# Patient Record
Sex: Female | Born: 1945
Health system: Southern US, Community
[De-identification: ages and names within clinical notes are randomized; demographics above are authoritative.]

## PROBLEM LIST (undated history)

## (undated) DIAGNOSIS — Z789 Other specified health status: Secondary | ICD-10-CM

## (undated) DIAGNOSIS — D649 Anemia, unspecified: Secondary | ICD-10-CM

## (undated) DIAGNOSIS — M8580 Other specified disorders of bone density and structure, unspecified site: Secondary | ICD-10-CM

## (undated) DIAGNOSIS — D509 Iron deficiency anemia, unspecified: Secondary | ICD-10-CM

## (undated) HISTORY — DX: Anemia, unspecified: D64.9

## (undated) HISTORY — DX: Other specified disorders of bone density and structure, unspecified site: M85.80

## (undated) HISTORY — DX: Other specified health status: Z78.9

## (undated) HISTORY — DX: Iron deficiency anemia, unspecified: D50.9

## (undated) HISTORY — PX: OTHER SURGICAL HISTORY: SHX169

---

## 2001-11-13 ENCOUNTER — Encounter: Payer: Self-pay | Admitting: Orthopedic Surgery

## 2001-11-13 ENCOUNTER — Ambulatory Visit (HOSPITAL_COMMUNITY): Admission: RE | Admit: 2001-11-13 | Discharge: 2001-11-13 | Payer: Self-pay | Admitting: Orthopedic Surgery

## 2009-12-12 HISTORY — PX: APPENDECTOMY: SHX54

## 2010-01-06 ENCOUNTER — Inpatient Hospital Stay (HOSPITAL_COMMUNITY): Admission: EM | Admit: 2010-01-06 | Discharge: 2010-01-10 | Payer: Self-pay | Admitting: Emergency Medicine

## 2010-01-06 ENCOUNTER — Encounter (INDEPENDENT_AMBULATORY_CARE_PROVIDER_SITE_OTHER): Payer: Self-pay

## 2010-08-19 ENCOUNTER — Ambulatory Visit: Payer: Self-pay | Admitting: Oncology

## 2010-09-01 LAB — COMPREHENSIVE METABOLIC PANEL
ALT: 27 U/L (ref 0–35)
AST: 25 U/L (ref 0–37)
Albumin: 4.1 g/dL (ref 3.5–5.2)
Alkaline Phosphatase: 51 U/L (ref 39–117)
BUN: 16 mg/dL (ref 6–23)
CO2: 29 mEq/L (ref 19–32)
Calcium: 9.6 mg/dL (ref 8.4–10.5)
Chloride: 105 mEq/L (ref 96–112)
Creatinine, Ser: 0.76 mg/dL (ref 0.40–1.20)
Glucose, Bld: 112 mg/dL — ABNORMAL HIGH (ref 70–99)
Potassium: 4 mEq/L (ref 3.5–5.3)
Sodium: 141 mEq/L (ref 135–145)
Total Bilirubin: 1.1 mg/dL (ref 0.3–1.2)
Total Protein: 7.7 g/dL (ref 6.0–8.3)

## 2010-09-01 LAB — MORPHOLOGY: PLT EST: ADEQUATE

## 2010-09-01 LAB — CBC & DIFF AND RETIC
BASO%: 1.1 % (ref 0.0–2.0)
Basophils Absolute: 0.1 10*3/uL (ref 0.0–0.1)
EOS%: 1.1 % (ref 0.0–7.0)
Eosinophils Absolute: 0.1 10*3/uL (ref 0.0–0.5)
HCT: 33.5 % — ABNORMAL LOW (ref 34.8–46.6)
HGB: 11.3 g/dL — ABNORMAL LOW (ref 11.6–15.9)
Immature Retic Fract: 3.9 % (ref 0.00–10.70)
LYMPH%: 32.9 % (ref 14.0–49.7)
MCH: 32.2 pg (ref 25.1–34.0)
MCHC: 33.7 g/dL (ref 31.5–36.0)
MCV: 95.4 fL (ref 79.5–101.0)
MONO#: 0.3 10*3/uL (ref 0.1–0.9)
MONO%: 7.3 % (ref 0.0–14.0)
NEUT#: 2.5 10*3/uL (ref 1.5–6.5)
NEUT%: 57.6 % (ref 38.4–76.8)
Platelets: 284 10*3/uL (ref 145–400)
RBC: 3.51 10*6/uL — ABNORMAL LOW (ref 3.70–5.45)
RDW: 12.8 % (ref 11.2–14.5)
Retic %: 0.8 % (ref 0.50–1.50)
Retic Ct Abs: 28.08 10*3/uL (ref 18.30–72.70)
WBC: 4.4 10*3/uL (ref 3.9–10.3)
lymph#: 1.4 10*3/uL (ref 0.9–3.3)

## 2010-09-01 LAB — CHCC SMEAR

## 2010-09-01 LAB — LACTATE DEHYDROGENASE: LDH: 133 U/L (ref 94–250)

## 2010-09-06 LAB — IRON AND TIBC
%SAT: 26 % (ref 20–55)
Iron: 93 ug/dL (ref 42–145)
TIBC: 363 ug/dL (ref 250–470)
UIBC: 270 ug/dL

## 2010-09-06 LAB — HAPTOGLOBIN: Haptoglobin: 95 mg/dL (ref 16–200)

## 2010-09-06 LAB — PROTEIN ELECTROPHORESIS, SERUM
Albumin ELP: 60 % (ref 55.8–66.1)
Alpha-1-Globulin: 3.9 % (ref 2.9–4.9)
Alpha-2-Globulin: 10.1 % (ref 7.1–11.8)
Beta 2: 5 % (ref 3.2–6.5)
Beta Globulin: 6.2 % (ref 4.7–7.2)
Gamma Globulin: 14.8 % (ref 11.1–18.8)
Total Protein, Serum Electrophoresis: 7.3 g/dL (ref 6.0–8.3)

## 2010-09-06 LAB — FERRITIN: Ferritin: 16 ng/mL (ref 10–291)

## 2010-09-06 LAB — TSH: TSH: 1.138 u[IU]/mL (ref 0.350–4.500)

## 2010-09-06 LAB — METHYLMALONIC ACID, SERUM: Methylmalonic Acid, Quantitative: 199 nmol/L (ref 87–318)

## 2010-09-06 LAB — SEDIMENTATION RATE: Sed Rate: 8 mm/hr (ref 0–22)

## 2010-09-06 LAB — FOLATE: Folate: >20 ng/mL

## 2010-10-31 LAB — URINALYSIS, ROUTINE W REFLEX MICROSCOPIC
Bilirubin Urine: NEGATIVE
Hgb urine dipstick: NEGATIVE
Ketones, ur: NEGATIVE mg/dL
Nitrite: NEGATIVE
pH: 6 (ref 5.0–8.0)

## 2010-10-31 LAB — POCT I-STAT, CHEM 8
BUN: 10 mg/dL (ref 6–23)
Calcium, Ion: 1.02 mmol/L — ABNORMAL LOW (ref 1.12–1.32)
Chloride: 110 mEq/L (ref 96–112)
Creatinine, Ser: 0.7 mg/dL (ref 0.4–1.2)
Glucose, Bld: 152 mg/dL — ABNORMAL HIGH (ref 70–99)
HCT: 43 % (ref 36.0–46.0)

## 2010-10-31 LAB — CBC
HCT: 26.2 % — ABNORMAL LOW (ref 36.0–46.0)
HCT: 31.1 % — ABNORMAL LOW (ref 36.0–46.0)
Hemoglobin: 10.6 g/dL — ABNORMAL LOW (ref 12.0–15.0)
Hemoglobin: 9.1 g/dL — ABNORMAL LOW (ref 12.0–15.0)
MCHC: 34 g/dL (ref 30.0–36.0)
MCHC: 34.9 g/dL (ref 30.0–36.0)
MCV: 97.3 fL (ref 78.0–100.0)
MCV: 97.9 fL (ref 78.0–100.0)
Platelets: 218 10*3/uL (ref 150–400)
Platelets: 242 10*3/uL (ref 150–400)
RBC: 3.18 MIL/uL — ABNORMAL LOW (ref 3.87–5.11)
RDW: 13.5 % (ref 11.5–15.5)
RDW: 13.9 % (ref 11.5–15.5)
WBC: 7.4 10*3/uL (ref 4.0–10.5)

## 2010-10-31 LAB — DIFFERENTIAL
Basophils Absolute: 0 10*3/uL (ref 0.0–0.1)
Basophils Relative: 0 % (ref 0–1)
Eosinophils Absolute: 0 10*3/uL (ref 0.0–0.7)
Eosinophils Relative: 0 % (ref 0–5)
Lymphocytes Relative: 20 % (ref 12–46)
Lymphs Abs: 1.5 10*3/uL (ref 0.7–4.0)
Monocytes Absolute: 0.3 10*3/uL (ref 0.1–1.0)
Monocytes Relative: 5 % (ref 3–12)
Neutro Abs: 5.6 10*3/uL (ref 1.7–7.7)
Neutrophils Relative %: 75 % (ref 43–77)

## 2010-12-01 ENCOUNTER — Other Ambulatory Visit: Payer: Self-pay | Admitting: Medical

## 2010-12-01 ENCOUNTER — Encounter (HOSPITAL_BASED_OUTPATIENT_CLINIC_OR_DEPARTMENT_OTHER): Payer: BC Managed Care – PPO | Admitting: Oncology

## 2010-12-01 DIAGNOSIS — D649 Anemia, unspecified: Secondary | ICD-10-CM

## 2010-12-01 LAB — CBC WITH DIFFERENTIAL/PLATELET
EOS%: 1.5 % (ref 0.0–7.0)
LYMPH%: 33.7 % (ref 14.0–49.7)
MCH: 31.7 pg (ref 25.1–34.0)
MCV: 94.9 fL (ref 79.5–101.0)
MONO%: 7 % (ref 0.0–14.0)
Platelets: 299 10*3/uL (ref 145–400)
RBC: 3.5 10*6/uL — ABNORMAL LOW (ref 3.70–5.45)
RDW: 12.8 % (ref 11.2–14.5)
nRBC: 0 % (ref 0–0)

## 2010-12-01 LAB — FERRITIN: Ferritin: 13 ng/mL (ref 10–291)

## 2010-12-01 LAB — COMPREHENSIVE METABOLIC PANEL
ALT: 26 U/L (ref 0–35)
CO2: 26 mEq/L (ref 19–32)
Calcium: 9.8 mg/dL (ref 8.4–10.5)
Chloride: 104 mEq/L (ref 96–112)
Creatinine, Ser: 0.75 mg/dL (ref 0.40–1.20)
Glucose, Bld: 141 mg/dL — ABNORMAL HIGH (ref 70–99)
Sodium: 142 mEq/L (ref 135–145)
Total Bilirubin: 0.8 mg/dL (ref 0.3–1.2)
Total Protein: 7.2 g/dL (ref 6.0–8.3)

## 2010-12-01 LAB — IRON AND TIBC
%SAT: 24 % (ref 20–55)
Iron: 87 ug/dL (ref 42–145)

## 2010-12-01 LAB — MORPHOLOGY

## 2011-03-09 ENCOUNTER — Other Ambulatory Visit: Payer: Self-pay | Admitting: Oncology

## 2011-03-09 ENCOUNTER — Encounter (HOSPITAL_BASED_OUTPATIENT_CLINIC_OR_DEPARTMENT_OTHER): Payer: Medicare Other | Admitting: Oncology

## 2011-03-09 DIAGNOSIS — D649 Anemia, unspecified: Secondary | ICD-10-CM

## 2011-03-09 LAB — CBC WITH DIFFERENTIAL/PLATELET
BASO%: 1.3 % (ref 0.0–2.0)
Basophils Absolute: 0.1 10*3/uL (ref 0.0–0.1)
EOS%: 4.5 % (ref 0.0–7.0)
HCT: 32.2 % — ABNORMAL LOW (ref 34.8–46.6)
HGB: 10.9 g/dL — ABNORMAL LOW (ref 11.6–15.9)
LYMPH%: 31.4 % (ref 14.0–49.7)
MCH: 33.1 pg (ref 25.1–34.0)
MCHC: 34.1 g/dL (ref 31.5–36.0)
MCV: 97.3 fL (ref 79.5–101.0)
MONO%: 7 % (ref 0.0–14.0)
NEUT%: 55.8 % (ref 38.4–76.8)
lymph#: 1.6 10*3/uL (ref 0.9–3.3)

## 2011-03-09 LAB — COMPREHENSIVE METABOLIC PANEL
ALT: 15 U/L (ref 0–35)
AST: 19 U/L (ref 0–37)
Alkaline Phosphatase: 45 U/L (ref 39–117)
BUN: 22 mg/dL (ref 6–23)
Calcium: 9.5 mg/dL (ref 8.4–10.5)
Creatinine, Ser: 0.87 mg/dL (ref 0.50–1.10)
Total Bilirubin: 0.5 mg/dL (ref 0.3–1.2)

## 2011-06-09 ENCOUNTER — Other Ambulatory Visit: Payer: Self-pay | Admitting: Oncology

## 2011-06-09 ENCOUNTER — Encounter (HOSPITAL_BASED_OUTPATIENT_CLINIC_OR_DEPARTMENT_OTHER): Payer: Medicare Other | Admitting: Oncology

## 2011-06-09 DIAGNOSIS — D649 Anemia, unspecified: Secondary | ICD-10-CM

## 2011-06-09 LAB — CBC WITH DIFFERENTIAL/PLATELET
Basophils Absolute: 0.1 10*3/uL (ref 0.0–0.1)
Eosinophils Absolute: 0.1 10*3/uL (ref 0.0–0.5)
HCT: 33.8 % — ABNORMAL LOW (ref 34.8–46.6)
HGB: 11.5 g/dL — ABNORMAL LOW (ref 11.6–15.9)
MCH: 33.3 pg (ref 25.1–34.0)
MCV: 98 fL (ref 79.5–101.0)
MONO%: 9.9 % (ref 0.0–14.0)
NEUT#: 2.3 10*3/uL (ref 1.5–6.5)
NEUT%: 49.4 % (ref 38.4–76.8)
RDW: 13 % (ref 11.2–14.5)

## 2011-07-15 ENCOUNTER — Other Ambulatory Visit: Payer: Self-pay | Admitting: Oncology

## 2011-07-17 ENCOUNTER — Telehealth: Payer: Self-pay | Admitting: Oncology

## 2011-07-17 NOTE — Telephone Encounter (Signed)
lmonvm advising the pt of her r/s appts from 1/2/12013 to 08/28/2011 due to the md will be on cme week of 09/04/2011

## 2011-08-18 ENCOUNTER — Other Ambulatory Visit: Payer: Self-pay | Admitting: Obstetrics and Gynecology

## 2011-08-18 ENCOUNTER — Other Ambulatory Visit (HOSPITAL_COMMUNITY)
Admission: RE | Admit: 2011-08-18 | Discharge: 2011-08-18 | Disposition: A | Discharge status: Home / Self Care | Payer: Medicare Other | Source: Ambulatory Visit | Attending: Obstetrics and Gynecology | Admitting: Obstetrics and Gynecology

## 2011-08-18 DIAGNOSIS — Z124 Encounter for screening for malignant neoplasm of cervix: Secondary | ICD-10-CM | POA: Insufficient documentation

## 2011-08-26 ENCOUNTER — Encounter: Payer: Self-pay | Admitting: Oncology

## 2011-08-26 DIAGNOSIS — D649 Anemia, unspecified: Secondary | ICD-10-CM | POA: Insufficient documentation

## 2011-08-26 DIAGNOSIS — M8580 Other specified disorders of bone density and structure, unspecified site: Secondary | ICD-10-CM | POA: Insufficient documentation

## 2011-08-28 ENCOUNTER — Other Ambulatory Visit (HOSPITAL_BASED_OUTPATIENT_CLINIC_OR_DEPARTMENT_OTHER): Payer: Medicare Other | Admitting: Lab

## 2011-08-28 ENCOUNTER — Other Ambulatory Visit: Payer: Self-pay | Admitting: Oncology

## 2011-08-28 ENCOUNTER — Ambulatory Visit (HOSPITAL_BASED_OUTPATIENT_CLINIC_OR_DEPARTMENT_OTHER): Payer: Medicare Other | Admitting: Oncology

## 2011-08-28 ENCOUNTER — Telehealth: Payer: Self-pay | Admitting: Oncology

## 2011-08-28 DIAGNOSIS — M899 Disorder of bone, unspecified: Secondary | ICD-10-CM

## 2011-08-28 DIAGNOSIS — M8580 Other specified disorders of bone density and structure, unspecified site: Secondary | ICD-10-CM

## 2011-08-28 DIAGNOSIS — D649 Anemia, unspecified: Secondary | ICD-10-CM

## 2011-08-28 LAB — CBC WITH DIFFERENTIAL/PLATELET
Eosinophils Absolute: 0.1 10*3/uL (ref 0.0–0.5)
HCT: 32.7 % — ABNORMAL LOW (ref 34.8–46.6)
LYMPH%: 40.2 % (ref 14.0–49.7)
MCHC: 34.7 g/dL (ref 31.5–36.0)
MCV: 96.3 fL (ref 79.5–101.0)
MONO#: 0.3 10*3/uL (ref 0.1–0.9)
MONO%: 9.7 % (ref 0.0–14.0)
NEUT#: 1.5 10*3/uL (ref 1.5–6.5)
NEUT%: 45.3 % (ref 38.4–76.8)
Platelets: 275 10*3/uL (ref 145–400)
WBC: 3.3 10*3/uL — ABNORMAL LOW (ref 3.9–10.3)

## 2011-08-28 LAB — IRON AND TIBC
%SAT: 27 % (ref 20–55)
Iron: 93 ug/dL (ref 42–145)
TIBC: 346 ug/dL (ref 250–470)
UIBC: 253 ug/dL (ref 125–400)

## 2011-08-28 LAB — COMPREHENSIVE METABOLIC PANEL
BUN: 15 mg/dL (ref 6–23)
CO2: 31 mEq/L (ref 19–32)
Calcium: 9.4 mg/dL (ref 8.4–10.5)
Chloride: 105 mEq/L (ref 96–112)
Creatinine, Ser: 0.82 mg/dL (ref 0.50–1.10)

## 2011-08-28 NOTE — Progress Notes (Signed)
[facility] OFFICE PROGRESS NOTE CC: Joanna Watkins. Cliffton Asters, M.D.   DIAGNOSIS:  Chronic normocytic anemia.  CURRENT THERAPY:  Watchful observation.  HISTORY OF PRESENT ILLNESS:  Joanna Watkins presents today for an office followup visit.  Overall she reports she has been doing relatively well. She has had anemia workup in the past and there was no clear evidence of any type of iron deficiency.  She had no evidence of any type of hemolysis on laboratory tests.  Of note in the past she did have a neo perforated appendicitis and her anemia possibly could be chronic inflammation from that.  She is not reporting any type of obvious bleeding such as melena, hematochezia, hematuria or any vaginal bleeding.  She is not reporting any headaches, any visual changes, nausea, vomiting, diarrhea, constipation, abdominal pain, abdominal swelling, lower extremity paresthesias, any bowel or bladder incontinence, any mucositis, dysphagia, odynophagia, any fevers, chills or night sweats.  No palpable lymph nodes.  She does not report any type of skin rash or joint swelling.  Overall she reports she has a good appetite and is eating and drinking quite well.SHe states her next full physical by her PCP is on Wednesday, Jan 23.   MEDICAL HISTORY: Past Medical History  Diagnosis Date  . Osteopenia   . Allergy history unknown   . Anemia     SURGICAL HISTORY:  Past Surgical History  Procedure Date  . Appendectomy 12/2009  . C4 fracture     MEDICATIONS: No current outpatient prescriptions on file.    ALLERGIES:   has no allergies on file.  REVIEW OF SYSTEMS:  The rest of the 14-point review of system was negative. She is up to date with her Pap exams and mammograms. She may be due for a colonoscopy this year. Last one was 1 year ago. She is to discuss with her primary physiscian on this issue.  Filed Vitals:   08/28/11 1009  BP: 119/73  Pulse: 67  Temp: 97.5 F (36.4 C)   Wt Readings from Last 3  Encounters:  08/28/11 111 lb (50.349 kg)   ECOG Performance status:   PHYSICAL EXAMINATION:  This is a very pleasant thin-appearing white female in no acute distress.  ECOG performance status is 0. HEENT:  Normocephalic, atraumatic skull.  There are no oropharyngeal lesions.  Neck:  Supple.  Lymph nodes:  Exam is negative for any cervical, supraclavicular or axillary adenopathy.  Lungs:  Clear bilaterally.  Cardiac exam is regular rate rhythm with a normal S1, S2.  There are no murmurs, rubs or bruits.  Abdominal exam is soft with good bowel sounds.  There is no palpable abdominal mass.  There is no palpable hepatosplenomegaly.  Musculoskeletal exam was no tenderness to spine, ribs or hips.  Extremities:  No clubbing, cyanosis or edema.  Neurologic is nonfocal.   LABORATORY/RADIOLOGY DATA:   Lab 08/28/11 0949  WBC 3.3*  HGB 11.4*  HCT 32.7*  PLT 275  MCV 96.3  MCH 33.5  MCHC 34.7  RDW 13.4  LYMPHSABS 1.3  MONOABS 0.3  EOSABS 0.1  BASOSABS 0.0  BANDABS --    CMP   No results found for this basename: NA:5,K:5,CL:5,CO2:5,GLUCOSE:5,BUN:5,CREATININE:5,GFRCGP,:5,CALCIUM:5,MG:5,AST:5,ALT:5,ALKPHOS:5,BILITOT:5 in the last 168 hours      Component Value Date/Time   BILITOT 0.5 03/09/2011 0905   BILITOT 0.5 03/09/2011 0905       ASSESSMENT AND PLAN:   1. Chronic normocytic anemia.  For the most part her hemoglobin has remained relatively stable.  We will go ahead and continue with observation.  If in the near future s severe anemia requiring frequent blood transfusions for hemoglobin less than 8, then we can consider bone marrow biopsy to assure that she has no primary bone marrow pathology. It is important to mention that her WBC is 3.3 with ANC of 1.5. Will monitor her counts in 3 months and decisions regarding BM procedure are to be made then 2. Followup.  Ms. Klingbeil will follow back up with lab only in April and return to see Dr. Gaylyn Rong on July  2013 with labs or before if should  there be questions or concerns.   Dr. Gaylyn Rong has spent 95 percent of the encounter with patient.

## 2011-08-28 NOTE — Telephone Encounter (Signed)
gv pt appt for [id number]-[id number]

## 2011-09-04 ENCOUNTER — Other Ambulatory Visit: Payer: Medicare Other | Admitting: Lab

## 2011-09-04 ENCOUNTER — Ambulatory Visit: Payer: Medicare Other | Admitting: Oncology

## 2011-11-27 ENCOUNTER — Other Ambulatory Visit (HOSPITAL_BASED_OUTPATIENT_CLINIC_OR_DEPARTMENT_OTHER): Payer: Medicare Other | Admitting: Lab

## 2011-11-27 DIAGNOSIS — D649 Anemia, unspecified: Secondary | ICD-10-CM

## 2011-11-27 DIAGNOSIS — M8580 Other specified disorders of bone density and structure, unspecified site: Secondary | ICD-10-CM

## 2011-11-27 LAB — CBC WITH DIFFERENTIAL/PLATELET
Basophils Absolute: 0.1 10*3/uL (ref 0.0–0.1)
EOS%: 1.7 % (ref 0.0–7.0)
MCH: 33.3 pg (ref 25.1–34.0)
MCHC: 34.1 g/dL (ref 31.5–36.0)
MCV: 97.6 fL (ref 79.5–101.0)
MONO%: 11 % (ref 0.0–14.0)
RBC: 3.55 10*6/uL — ABNORMAL LOW (ref 3.70–5.45)
RDW: 13.1 % (ref 11.2–14.5)
nRBC: 0 % (ref 0–0)

## 2011-11-28 ENCOUNTER — Telehealth: Payer: Self-pay

## 2011-11-28 NOTE — Telephone Encounter (Signed)
Message copied by Kallie Locks on Tue Nov 28, 2011  2:47 PM ------      Message from: HA, Raliegh Ip T      Created: Mon Nov 27, 2011  1:33 PM       Please call pt.  Her anemia has slightly improved.  Continue observation.  Thanks.

## 2011-11-28 NOTE — Telephone Encounter (Signed)
Message copied by Kallie Locks on Tue Nov 28, 2011  2:36 PM ------      Message from: HA, Raliegh Ip T      Created: Mon Nov 27, 2011  1:33 PM       Please call pt.  Her anemia has slightly improved.  Continue observation.  Thanks.

## 2012-02-26 ENCOUNTER — Other Ambulatory Visit (HOSPITAL_BASED_OUTPATIENT_CLINIC_OR_DEPARTMENT_OTHER): Payer: Medicare Other | Admitting: Lab

## 2012-02-26 ENCOUNTER — Telehealth: Payer: Self-pay

## 2012-02-26 DIAGNOSIS — M8580 Other specified disorders of bone density and structure, unspecified site: Secondary | ICD-10-CM

## 2012-02-26 DIAGNOSIS — D649 Anemia, unspecified: Secondary | ICD-10-CM

## 2012-02-26 LAB — CBC WITH DIFFERENTIAL/PLATELET
BASO%: 1.9 % (ref 0.0–2.0)
LYMPH%: 42 % (ref 14.0–49.7)
MCHC: 33.4 g/dL (ref 31.5–36.0)
MONO#: 0.3 10*3/uL (ref 0.1–0.9)
MONO%: 8.6 % (ref 0.0–14.0)
Platelets: 232 10*3/uL (ref 145–400)
RBC: 3.34 10*6/uL — ABNORMAL LOW (ref 3.70–5.45)
WBC: 3.8 10*3/uL — ABNORMAL LOW (ref 3.9–10.3)

## 2012-02-26 NOTE — Telephone Encounter (Signed)
Message copied by Kallie Locks on Mon Feb 26, 2012  4:44 PM ------      Message from: HA, Raliegh Ip T      Created: Mon Feb 26, 2012  1:43 PM       Please call pt.  Her anemia only slightly worsened.  She can take OTC oral iron once a day along with VitC to improve her anemia.  Thanks.

## 2012-06-02 NOTE — Patient Instructions (Addendum)
1.  Anemia: most likely anemia of chronic disease (dyserythropoeisis - malformation of red blood cells).  However, borderline iron deficiency is seen.  Please make sure that your colonoscopy is up to date.  2.  I suggest a trial of IV iron.   3.  Follow up:  Lab test every 4 months.  Return visit in about 1 year.

## 2012-06-03 ENCOUNTER — Encounter: Payer: Self-pay | Admitting: Oncology

## 2012-06-03 ENCOUNTER — Telehealth: Payer: Self-pay | Admitting: Oncology

## 2012-06-03 ENCOUNTER — Other Ambulatory Visit (HOSPITAL_BASED_OUTPATIENT_CLINIC_OR_DEPARTMENT_OTHER): Payer: Medicare Other | Admitting: Lab

## 2012-06-03 ENCOUNTER — Ambulatory Visit (HOSPITAL_BASED_OUTPATIENT_CLINIC_OR_DEPARTMENT_OTHER): Payer: Medicare Other | Admitting: Oncology

## 2012-06-03 VITALS — BP 112/64 | HR 65 | Temp 96.9°F | Resp 18 | Ht 65.5 in | Wt 110.8 lb

## 2012-06-03 DIAGNOSIS — D509 Iron deficiency anemia, unspecified: Secondary | ICD-10-CM

## 2012-06-03 DIAGNOSIS — D649 Anemia, unspecified: Secondary | ICD-10-CM

## 2012-06-03 DIAGNOSIS — M8580 Other specified disorders of bone density and structure, unspecified site: Secondary | ICD-10-CM

## 2012-06-03 DIAGNOSIS — M899 Disorder of bone, unspecified: Secondary | ICD-10-CM

## 2012-06-03 HISTORY — DX: Iron deficiency anemia, unspecified: D50.9

## 2012-06-03 LAB — CBC WITH DIFFERENTIAL/PLATELET
Basophils Absolute: 0.1 10*3/uL (ref 0.0–0.1)
EOS%: 3.9 % (ref 0.0–7.0)
Eosinophils Absolute: 0.1 10*3/uL (ref 0.0–0.5)
HGB: 11.1 g/dL — ABNORMAL LOW (ref 11.6–15.9)
LYMPH%: 40 % (ref 14.0–49.7)
MCH: 33.6 pg (ref 25.1–34.0)
MCV: 99.5 fL (ref 79.5–101.0)
MONO%: 10.3 % (ref 0.0–14.0)
NEUT%: 44 % (ref 38.4–76.8)
Platelets: 238 10*3/uL (ref 145–400)
WBC: 3.6 10*3/uL — ABNORMAL LOW (ref 3.9–10.3)

## 2012-06-03 LAB — COMPREHENSIVE METABOLIC PANEL (CC13)
ALT: 34 U/L (ref 0–55)
CO2: 26 mEq/L (ref 22–29)
Calcium: 9.7 mg/dL (ref 8.4–10.4)
Chloride: 108 mEq/L — ABNORMAL HIGH (ref 98–107)
Sodium: 141 mEq/L (ref 136–145)
Total Bilirubin: 0.8 mg/dL (ref 0.20–1.20)
Total Protein: 6.2 g/dL — ABNORMAL LOW (ref 6.4–8.3)

## 2012-06-03 LAB — IRON AND TIBC: Iron: 79 ug/dL (ref 42–145)

## 2012-06-03 LAB — FERRITIN: Ferritin: 19 ng/mL (ref 10–291)

## 2012-06-03 LAB — CHCC SMEAR

## 2012-06-03 NOTE — Progress Notes (Signed)
[facility]  Telephone:([phone]) [phone] [phone]   OFFICE PROGRESS NOTE   Cc:  Cala Bradford, MD  DIAGNOSIS: Chronic normocytic anemia.   CURRENT THERAPY: Watchful observation.  INTERVAL HISTORY: Joanna Watkins 66 y.o. female returns for regular follow up by herself.  She reports feeling well.  She is taking oral iron which she does not know which brand without nausea/vomiting, abdominal pain, constipation.  She has not seen any visible source of bleeding. She has just returned home from a road [place].  She has always been thin; she has not noticed much weight loss.   Patient denies fever, anorexia, weight loss, fatigue, headache, visual changes, confusion, drenching night sweats, palpable lymph node swelling, mucositis, odynophagia, dysphagia, nausea vomiting, jaundice, chest pain, palpitation, shortness of breath, dyspnea on exertion, productive cough, gum bleeding, epistaxis, hematemesis, hemoptysis, abdominal pain, abdominal swelling, early satiety, melena, hematochezia, hematuria, skin rash, spontaneous bleeding, joint swelling, joint pain, heat or cold intolerance, bowel bladder incontinence, back pain, focal motor weakness, paresthesia, depression, suicidal or homicidal ideation, feeling hopelessness.   Past Medical History  Diagnosis Date  . Osteopenia   . Allergy history unknown   . Anemia   . Iron deficiency anemia 06/03/2012    Past Surgical History  Procedure Date  . Appendectomy 12/2009  . C4 fracture     No current outpatient prescriptions on file.    ALLERGIES:   has no known allergies.  REVIEW OF SYSTEMS:  The rest of the 14-point review of system was negative.   Filed Vitals:   06/03/12 1010  BP: 112/64  Pulse: 65  Temp: 96.9 F (36.1 C)  Resp: 18   Wt Readings from Last 3 Encounters:  06/03/12 110 lb 12.8 oz (50.259 kg)  08/28/11 111 lb (50.349 kg)   ECOG Performance status: 0  PHYSICAL EXAMINATION:    General:  Thin-appearing woman, in no acute distress.  Eyes:  no scleral icterus.  ENT:  There were no oropharyngeal lesions.  Neck was without thyromegaly.  Lymphatics:  Negative cervical, supraclavicular or axillary adenopathy.  Respiratory: lungs were clear bilaterally without wheezing or crackles.  Cardiovascular:  Regular rate and rhythm, S1/S2, without murmur, rub or gallop.  There was no pedal edema.  GI:  abdomen was soft, flat, nontender, nondistended, without organomegaly.  Muscoloskeletal:  no spinal tenderness of palpation of vertebral spine.  Skin exam was without echymosis, petichae.  Neuro exam was nonfocal.  Patient was able to get on and off exam table without assistance.  Gait was normal.  Patient was alerted and oriented.  Attention was good.   Language was appropriate.  Mood was normal without depression.  Speech was not pressured.  Thought content was not tangential.     LABORATORY/RADIOLOGY DATA:  Lab Results  Component Value Date   WBC 3.6* 06/03/2012   HGB 11.1* 06/03/2012   HCT 32.8* 06/03/2012   PLT 238 06/03/2012   GLUCOSE 81 06/03/2012   ALKPHOS 59 06/03/2012   ALT 34 06/03/2012   AST 26 06/03/2012   NA 141 06/03/2012   K 4.0 06/03/2012   CL 108* 06/03/2012   CREATININE 0.8 06/03/2012   BUN 23.0 06/03/2012   CO2 26 06/03/2012     ASSESSMENT AND PLAN:   1. Chronic normocytic anemia.  She had borderline iron panel.  Her colonoscopy with [facility] in 2012 was reportedly negative.  I advised her to continue daily iron for now.  If her ferritin does  not improve significantly from once daily iron, I may consider twice daily.  She does not have severe anemia to warrant IV iron yet.  She is reportedly up to date with age-appropriate colon cancer screening with colonoscopy which was negative. She can also have early stage MDS.  She does not have leukopenia or thrombocytopenia.  And with her only mild anemia, a diagnostic bone marrow biopsy at this time would be  premature.  2. Followup. Lab appointment at the [facility] in about 4 and 8 months.  Return visit in about 1 year.     The length of time of the face-to-face encounter was 15 minutes. More than 50% of time was spent counseling and coordination of care.

## 2012-06-03 NOTE — Telephone Encounter (Signed)
appts made and printed for pt aom °

## 2012-09-06 ENCOUNTER — Telehealth: Payer: Self-pay | Admitting: Oncology

## 2012-09-06 NOTE — Telephone Encounter (Signed)
returned pts call and she needed to r/s her lab.Joanna KitchenMarland KitchenMarland KitchenDone

## 2012-09-12 ENCOUNTER — Encounter: Payer: Self-pay | Admitting: *Deleted

## 2012-09-12 NOTE — Progress Notes (Signed)
CBC received from [facility] lab and forwarded to Dr. Gaylyn Rong for review.

## 2012-09-23 ENCOUNTER — Other Ambulatory Visit: Payer: Medicare Other

## 2012-09-25 ENCOUNTER — Other Ambulatory Visit (HOSPITAL_BASED_OUTPATIENT_CLINIC_OR_DEPARTMENT_OTHER): Payer: Medicare Other | Admitting: Lab

## 2012-09-25 DIAGNOSIS — D649 Anemia, unspecified: Secondary | ICD-10-CM

## 2012-09-25 DIAGNOSIS — D509 Iron deficiency anemia, unspecified: Secondary | ICD-10-CM

## 2012-09-25 LAB — COMPREHENSIVE METABOLIC PANEL (CC13)
ALT: 31 U/L (ref 0–55)
Albumin: 3.8 g/dL (ref 3.5–5.0)
BUN: 22.7 mg/dL (ref 7.0–26.0)
CO2: 28 mEq/L (ref 22–29)
Calcium: 9.4 mg/dL (ref 8.4–10.4)
Chloride: 104 mEq/L (ref 98–107)
Creatinine: 0.8 mg/dL (ref 0.6–1.1)

## 2012-09-25 LAB — IRON AND TIBC
%SAT: 29 % (ref 20–55)
Iron: 91 ug/dL (ref 42–145)
TIBC: 319 ug/dL (ref 250–470)

## 2012-09-25 LAB — CBC WITH DIFFERENTIAL/PLATELET
Basophils Absolute: 0.1 10*3/uL (ref 0.0–0.1)
HCT: 35.5 % (ref 34.8–46.6)
HGB: 12.1 g/dL (ref 11.6–15.9)
MONO#: 0.4 10*3/uL (ref 0.1–0.9)
NEUT#: 2.2 10*3/uL (ref 1.5–6.5)
NEUT%: 51.2 % (ref 38.4–76.8)
WBC: 4.4 10*3/uL (ref 3.9–10.3)
lymph#: 1.5 10*3/uL (ref 0.9–3.3)

## 2012-09-26 ENCOUNTER — Encounter: Payer: Self-pay | Admitting: Oncology

## 2012-09-30 ENCOUNTER — Other Ambulatory Visit: Payer: Medicare Other

## 2013-01-27 ENCOUNTER — Other Ambulatory Visit (HOSPITAL_BASED_OUTPATIENT_CLINIC_OR_DEPARTMENT_OTHER): Payer: Medicare Other

## 2013-01-27 DIAGNOSIS — D649 Anemia, unspecified: Secondary | ICD-10-CM

## 2013-01-27 DIAGNOSIS — D509 Iron deficiency anemia, unspecified: Secondary | ICD-10-CM

## 2013-01-27 LAB — CBC WITH DIFFERENTIAL/PLATELET
BASO%: 2.2 % — ABNORMAL HIGH (ref 0.0–2.0)
EOS%: 2.1 % (ref 0.0–7.0)
HCT: 34.3 % — ABNORMAL LOW (ref 34.8–46.6)
MCH: 33.4 pg (ref 25.1–34.0)
MCHC: 34.7 g/dL (ref 31.5–36.0)
MCV: 96.1 fL (ref 79.5–101.0)
MONO%: 9.6 % (ref 0.0–14.0)
NEUT%: 48.9 % (ref 38.4–76.8)
lymph#: 1.4 10*3/uL (ref 0.9–3.3)

## 2013-03-19 ENCOUNTER — Other Ambulatory Visit: Payer: Self-pay

## 2013-04-24 ENCOUNTER — Encounter (HOSPITAL_COMMUNITY): Payer: Self-pay | Admitting: Emergency Medicine

## 2013-04-24 ENCOUNTER — Emergency Department (HOSPITAL_COMMUNITY)
Admission: EM | Admit: 2013-04-24 | Discharge: 2013-04-24 | Disposition: A | Discharge status: Home / Self Care | Payer: Medicare Other | Attending: Emergency Medicine | Admitting: Emergency Medicine

## 2013-04-24 DIAGNOSIS — R61 Generalized hyperhidrosis: Secondary | ICD-10-CM | POA: Insufficient documentation

## 2013-04-24 DIAGNOSIS — Z8739 Personal history of other diseases of the musculoskeletal system and connective tissue: Secondary | ICD-10-CM | POA: Insufficient documentation

## 2013-04-24 DIAGNOSIS — R55 Syncope and collapse: Secondary | ICD-10-CM | POA: Insufficient documentation

## 2013-04-24 DIAGNOSIS — R110 Nausea: Secondary | ICD-10-CM | POA: Insufficient documentation

## 2013-04-24 DIAGNOSIS — Z862 Personal history of diseases of the blood and blood-forming organs and certain disorders involving the immune mechanism: Secondary | ICD-10-CM | POA: Insufficient documentation

## 2013-04-24 DIAGNOSIS — R42 Dizziness and giddiness: Secondary | ICD-10-CM | POA: Insufficient documentation

## 2013-04-24 LAB — URINALYSIS, ROUTINE W REFLEX MICROSCOPIC
Bilirubin Urine: NEGATIVE
Ketones, ur: NEGATIVE mg/dL
Nitrite: NEGATIVE
Specific Gravity, Urine: 1.012 (ref 1.005–1.030)
Urobilinogen, UA: 0.2 mg/dL (ref 0.0–1.0)

## 2013-04-24 LAB — COMPREHENSIVE METABOLIC PANEL
ALT: 22 U/L (ref 0–35)
BUN: 13 mg/dL (ref 6–23)
CO2: 26 mEq/L (ref 19–32)
Calcium: 9.8 mg/dL (ref 8.4–10.5)
Creatinine, Ser: 0.72 mg/dL (ref 0.50–1.10)
GFR calc Af Amer: >90 mL/min (ref >90)
GFR calc non Af Amer: 87 mL/min — ABNORMAL LOW (ref >90)
Glucose, Bld: 100 mg/dL — ABNORMAL HIGH (ref 70–99)

## 2013-04-24 LAB — CBC WITH DIFFERENTIAL/PLATELET
Eosinophils Relative: 1 % (ref 0–5)
Hemoglobin: 12.2 g/dL (ref 12.0–15.0)
Lymphocytes Relative: 18 % (ref 12–46)
Lymphs Abs: 1.2 10*3/uL (ref 0.7–4.0)
MCH: 32.4 pg (ref 26.0–34.0)
MCHC: 33.4 g/dL (ref 30.0–36.0)
Monocytes Absolute: 0.4 10*3/uL (ref 0.1–1.0)
Monocytes Relative: 5 % (ref 3–12)

## 2013-04-24 NOTE — ED Provider Notes (Addendum)
CSN: [id number]     Arrival date & time 04/24/13  1143 History   First MD Initiated Contact with Patient 04/24/13 1145     Chief Complaint  Patient presents with  . Near Syncope   (Consider location/radiation/quality/duration/timing/severity/associated sxs/prior Treatment) HPI Comments: Patient was at her aerobics class today in about 10-15 minutes into the warmup she began feeling lightheaded, weak and nauseated and sweaty. She sat down and drank some water and felt better but when she stood up and tried to resume class symptoms returned. They resolved after she sat down into the drink of water as she was walking out to her car a retired Teacher, music asked her if she should check her blood pressure and when he did it she had orthostatic findings. They laid her down and elevated her feet which improved her symptoms. She states all the symptoms started with ringing in the ears. She denies any recent trauma she had no full loss of consciousness. She denied any palpitations, chest pain, abdominal pain or localized weakness during the events. She did eat prior to going to class today and states she drinks water frequently and has not done any activity that would've dehydrated her recently. She denies any infectious symptoms especially urinary symptoms. She currently is asymptomatic. She states this happened once before about 2 weeks ago and resolved spontaneously. Her only history is that of anemia but she takes no current medications.  The history is provided by the patient.    Past Medical History  Diagnosis Date  . Osteopenia   . Allergy history unknown   . Anemia   . Iron deficiency anemia 06/03/2012   Past Surgical History  Procedure Laterality Date  . Appendectomy  12/2009  . C4 fracture     No family history on file. History  Substance Use Topics  . Smoking status: Never Smoker   . Smokeless tobacco: Not on file  . Alcohol Use: No   OB History   Grav Para Term Preterm Abortions  TAB SAB Ect Mult Living                 Review of Systems  Constitutional: Positive for diaphoresis. Negative for fever.  Respiratory: Negative for cough and shortness of breath.   Cardiovascular: Positive for syncope. Negative for chest pain, palpitations and leg swelling.  Gastrointestinal: Positive for nausea. Negative for vomiting and diarrhea.  Genitourinary: Negative for dysuria.  All other systems reviewed and are negative.    Allergies  Review of patient's allergies indicates no known allergies.  Home Medications  No current outpatient prescriptions on file. BP 114/55  Temp(Src) 99 F (37.2 C) (Oral)  Resp 21  Ht 5\' 6"  (1.676 m)  Wt 108 lb (48.988 kg)  BMI 17.44 kg/m2  SpO2 100% Physical Exam  Nursing note and vitals reviewed. Constitutional: She is oriented to person, place, and time. She appears well-developed and well-nourished. No distress.  HENT:  Head: Normocephalic and atraumatic.  Mouth/Throat: Oropharynx is clear and moist.  Eyes: Conjunctivae and EOM are normal. Pupils are equal, round, and reactive to light.  Neck: Normal range of motion. Neck supple.  Cardiovascular: Normal rate, regular rhythm and intact distal pulses.   No murmur heard. Pulmonary/Chest: Effort normal and breath sounds normal. No respiratory distress. She has no wheezes. She has no rales.  Abdominal: Soft. She exhibits no distension. There is no tenderness. There is no rebound and no guarding.  Musculoskeletal: Normal range of motion. She exhibits no edema and  no tenderness.  Neurological: She is alert and oriented to person, place, and time.  Skin: Skin is warm and dry. No rash noted. No erythema.  Psychiatric: She has a normal mood and affect. Her behavior is normal.    ED Course  Procedures (including critical care time) Labs Review Labs Reviewed  CBC WITH DIFFERENTIAL - Abnormal; Notable for the following:    RBC 3.76 (*)    All other components within normal limits   URINALYSIS, ROUTINE W REFLEX MICROSCOPIC - Abnormal; Notable for the following:    APPearance CLOUDY (*)    All other components within normal limits  COMPREHENSIVE METABOLIC PANEL   Imaging Review No results found.   Date: 04/24/2013  Rate: 57  Rhythm: normal sinus rhythm  QRS Axis: normal  Intervals: normal  ST/T Wave abnormalities: normal  Conduction Disutrbances: none  Narrative Interpretation: unremarkable      MDM   1. Near syncope     Patient with a near syncopal episode today without history of palpitations, chest pain, abdominal pain. She had prodromal symptoms suggestive of orthostasis. It occurred while she was standing doing warmup in her exercise class. Symptoms recurred every time she stood back up a retired EMS Tuesday or checked her blood pressure and it dropped from lying to standing and he recommended she come here for further care. She has received 400 cc of fluid and is currently asymptomatic. She has no cardiac history takes no medications but does have a history of anemia. She denies any change in stool color and is well-appearing here with a normal blood pressure.  Feel that symptoms were most likely orthostatic but we'll check a CBC, CMP, urine, EKG for further evaluation for electrolyte abnormalities, anemia or an underlying infection.  3:01 PM Labs wnl.  Orthostatic positive with increase in HR from 57-82 and BP from 107 to 90 with standing.  Feeling better after fluids and will have her f/u with PCP.  Spoke with Laurann Montana and they will provide follow up for pt.  Gwyneth Sprout, MD 04/24/13 1512  Gwyneth Sprout, MD 04/24/13 [phone]

## 2013-04-24 NOTE — ED Notes (Signed)
Pt reports she went to her exercise class, in the beginning her ear felt stopped up and started ringing, then 5 mins later she started to get a lightheaded and clammy, sat down and started to feel a little better, tried to resume her class then it happened again so she stopped, decided to go home. One of the people there asked her to sit down and then called EMS. Pt in nad, skin warm and dry, resp e/u. Denies CP/SOB.

## 2013-04-24 NOTE — ED Notes (Signed)
Per EMS - pt coming from exercise class. About 5 mins into class, started to have ringing in her ears and felt very lightheaded, she laid down. No LOC. positive for orthostatic changes when standing. Started an 18G in right AC. Administered approx 400 ml of NS. HR 68 BP 114/70 sitting, CBG 110.

## 2013-04-24 NOTE — ED Notes (Signed)
Inetta Fermo, NT attempted to stick pt to get bld work, unsuccessful.

## 2013-05-30 ENCOUNTER — Telehealth: Payer: Self-pay | Admitting: Hematology and Oncology

## 2013-05-30 NOTE — Telephone Encounter (Signed)
Moved 10/20 appt w/KC to 11/10 w/NG. lmonvm for pt re change w/new d/t for 11/10. Schedule mailed.

## 2013-06-02 ENCOUNTER — Other Ambulatory Visit: Payer: Medicare Other | Admitting: Lab

## 2013-06-02 ENCOUNTER — Ambulatory Visit: Payer: Medicare Other | Admitting: Oncology

## 2013-06-19 ENCOUNTER — Other Ambulatory Visit: Payer: Self-pay

## 2013-06-20 ENCOUNTER — Other Ambulatory Visit: Payer: Self-pay | Admitting: Hematology and Oncology

## 2013-06-20 DIAGNOSIS — D649 Anemia, unspecified: Secondary | ICD-10-CM

## 2013-06-23 ENCOUNTER — Ambulatory Visit (HOSPITAL_BASED_OUTPATIENT_CLINIC_OR_DEPARTMENT_OTHER): Payer: Medicare Other | Admitting: Hematology and Oncology

## 2013-06-23 ENCOUNTER — Encounter: Payer: Self-pay | Admitting: Hematology and Oncology

## 2013-06-23 ENCOUNTER — Other Ambulatory Visit (HOSPITAL_BASED_OUTPATIENT_CLINIC_OR_DEPARTMENT_OTHER): Payer: Medicare Other | Admitting: Lab

## 2013-06-23 VITALS — BP 133/77 | HR 66 | Temp 97.0°F | Resp 18 | Ht 66.0 in | Wt 111.4 lb

## 2013-06-23 DIAGNOSIS — D649 Anemia, unspecified: Secondary | ICD-10-CM

## 2013-06-23 LAB — CBC & DIFF AND RETIC
EOS%: 1 % (ref 0.0–7.0)
MCH: 32.2 pg (ref 25.1–34.0)
MCHC: 33.2 g/dL (ref 31.5–36.0)
MCV: 96.9 fL (ref 79.5–101.0)
MONO%: 7.1 % (ref 0.0–14.0)
NEUT#: 2.5 10*3/uL (ref 1.5–6.5)
RBC: 3.54 10*6/uL — ABNORMAL LOW (ref 3.70–5.45)
RDW: 12.6 % (ref 11.2–14.5)
Retic %: 1.11 % (ref 0.70–2.10)
nRBC: 0 % (ref 0–0)

## 2013-06-23 NOTE — Progress Notes (Signed)
[facility] OFFICE PROGRESS NOTE  WHITE,[name], MD DIAGNOSIS:  Chronic anemia with possible component of iron deficiency  SUMMARY OF HEMATOLOGIC HISTORY: This is a pleasant lady who had followup fossa reveals due to borderline anemia and iron deficiency. INTERVAL HISTORY: Joanna Watkins 67 y.o. female returns for further followup. The patient denies any recent signs or symptoms of bleeding such as spontaneous epistaxis, hematuria or hematochezia. The patient has been taking oral iron supplement twice a day with food. She has not donated blood. She denies any recent blood transfusion. Her last colonoscopy was approximately 3 years ago that was normal. She has no symptoms of anemia such as headache, shortness of breath or chest pain or muscle cramp. Denies any excessive over-the-counter nonsteroidal anti-inflammatory medications. Denies any pica. She does not eat a lot of red meat or meat in general  I have reviewed the past medical history, past surgical history, social history and family history with the patient and they are unchanged from previous note.  ALLERGIES:  is allergic to vicodin.  MEDICATIONS:  Current Outpatient Prescriptions  Medication Sig Dispense Refill  . calcium-vitamin D (OSCAL WITH D) 500-200 MG-UNIT per tablet Take 1 tablet by mouth 2 (two) times daily.      . Cholecalciferol (VITAMIN D) 2000 UNITS CAPS Take 1 capsule by mouth 2 (two) times daily.      . ferrous sulfate dried (SLOW FE) 160 (50 FE) MG TBCR SR tablet Take 160 mg by mouth 2 (two) times daily with a meal.      . magnesium oxide (MAG-OX) 400 MG tablet Take 400 mg by mouth daily.      . Multiple Vitamins-Minerals (ICAPS) CAPS Take 1 capsule by mouth 2 (two) times daily.       No current facility-administered medications for this visit.     REVIEW OF SYSTEMS:   Constitutional: Denies fevers, chills or night sweats Eyes: Denies blurriness of vision Ears, nose, mouth, throat, and  face: Denies mucositis or sore throat Respiratory: Denies cough, dyspnea or wheezes Cardiovascular: Denies palpitation, chest discomfort or lower extremity swelling Gastrointestinal:  Denies nausea, heartburn or change in bowel habits Skin: Denies abnormal skin rashes Lymphatics: Denies new lymphadenopathy or easy bruising Neurological:Denies numbness, tingling or new weaknesses Behavioral/Psych: Mood is stable, no new changes  All other systems were reviewed with the patient and are negative.  PHYSICAL EXAMINATION: ECOG PERFORMANCE STATUS: 0 - Asymptomatic  Filed Vitals:   06/23/13 1430  BP: 133/77  Pulse: 66  Temp: 97 F (36.1 C)  Resp: 18   Filed Weights   06/23/13 1430  Weight: 111 lb 6.4 oz (50.531 kg)    GENERAL:alert, no distress and comfortable SKIN: skin color, texture, turgor are normal, no rashes or significant lesions EYES: normal, Conjunctiva are pink and non-injected, sclera clear OROPHARYNX:no exudate, no erythema and lips, buccal mucosa, and tongue normal  NECK: supple, thyroid normal size, non-tender, without nodularity LYMPH:  no palpable lymphadenopathy in the cervical, axillary or inguinal LUNGS: clear to auscultation and percussion with normal breathing effort HEART: regular rate & rhythm and no murmurs and no lower extremity edema ABDOMEN:abdomen soft, non-tender and normal bowel sounds Musculoskeletal:no cyanosis of digits and no clubbing  NEURO: alert & oriented x 3 with fluent speech, no focal motor/sensory deficits  LABORATORY DATA:  I have reviewed the data as listed Results for orders placed in visit on 06/23/13 (from the past 48 hour(s))  CBC & DIFF AND RETIC  Status: Abnormal   Collection Time    06/23/13  2:14 PM      Result Value Range   WBC 4.9  3.9 - 10.3 10e3/uL   NEUT# 2.5  1.5 - 6.5 10e3/uL   HGB 11.4 (*) 11.6 - 15.9 g/dL   HCT 16.1 (*) 09.6 - 04.5 %   Platelets 254  145 - 400 10e3/uL   MCV 96.9  79.5 - 101.0 fL   MCH 32.2   25.1 - 34.0 pg   MCHC 33.2  31.5 - 36.0 g/dL   RBC 4.09 (*) 8.11 - 9.14 10e6/uL   RDW 12.6  11.2 - 14.5 %   lymph# 2.0  0.9 - 3.3 10e3/uL   MONO# 0.4  0.1 - 0.9 10e3/uL   Eosinophils Absolute 0.1  0.0 - 0.5 10e3/uL   Basophils Absolute 0.0  0.0 - 0.1 10e3/uL   NEUT% 50.0  38.4 - 76.8 %   LYMPH% 41.1  14.0 - 49.7 %   MONO% 7.1  0.0 - 14.0 %   EOS% 1.0  0.0 - 7.0 %   BASO% 0.8  0.0 - 2.0 %   nRBC 0  0 - 0 %   Retic % 1.11  0.70 - 2.10 %   Retic Ct Abs 39.29  33.70 - 90.70 10e3/uL   Immature Retic Fract 3.20  1.60 - 10.00 %    ASSESSMENT:  Chronic anemia, with possible component of iron deficiency  PLAN:  #1 anemia This is likely anemia of chronic disease. The patient denies recent history of bleeding such as epistaxis, hematuria or hematochezia. She is asymptomatic from the anemia. We will observe for now.  She does not require transfusion now.  There is a component of iron deficiency based on her ferritin level drawn several years ago. The patient is taking oral iron supplement but incorrectly. I am waiting for her ferritin level to come back. I gave patient instructions on how to take oral iron supplement correctly. Once I have her results back I will call the patient. If her ferritin level is low, we might have to repeat some GI workup to find out the cause of her iron deficiency. All questions were answered. The patient knows to call the clinic with any problems, questions or concerns. No barriers to learning was detected.  I spent 15 minutes counseling the patient face to face. The total time spent in the appointment was 20 minutes and more than 50% was on counseling.     [facility], Deep Bonawitz, MD 06/23/2013 5:04 PM

## 2013-06-25 ENCOUNTER — Other Ambulatory Visit: Payer: Self-pay | Admitting: Hematology and Oncology

## 2013-06-25 DIAGNOSIS — D509 Iron deficiency anemia, unspecified: Secondary | ICD-10-CM

## 2013-06-26 ENCOUNTER — Telehealth: Payer: Self-pay | Admitting: Hematology and Oncology

## 2013-06-26 NOTE — Telephone Encounter (Signed)
m °

## 2013-08-25 ENCOUNTER — Ambulatory Visit
Admission: RE | Admit: 2013-08-25 | Discharge: 2013-08-25 | Disposition: A | Discharge status: Home / Self Care | Payer: Medicare Other | Source: Ambulatory Visit | Attending: Family Medicine | Admitting: Family Medicine

## 2013-08-25 ENCOUNTER — Other Ambulatory Visit: Payer: Self-pay | Admitting: Family Medicine

## 2013-08-25 DIAGNOSIS — R0789 Other chest pain: Secondary | ICD-10-CM

## 2013-12-22 ENCOUNTER — Other Ambulatory Visit (HOSPITAL_BASED_OUTPATIENT_CLINIC_OR_DEPARTMENT_OTHER): Payer: Medicare Other

## 2013-12-22 DIAGNOSIS — D509 Iron deficiency anemia, unspecified: Secondary | ICD-10-CM

## 2013-12-22 DIAGNOSIS — D649 Anemia, unspecified: Secondary | ICD-10-CM

## 2013-12-22 LAB — CBC & DIFF AND RETIC
BASO%: 1.8 % (ref 0.0–2.0)
Basophils Absolute: 0.1 10*3/uL (ref 0.0–0.1)
EOS ABS: 0.1 10*3/uL (ref 0.0–0.5)
EOS%: 3.8 % (ref 0.0–7.0)
HCT: 35.3 % (ref 34.8–46.6)
HEMOGLOBIN: 11.5 g/dL — AB (ref 11.6–15.9)
IMMATURE RETIC FRACT: 5.8 % (ref 1.60–10.00)
LYMPH#: 1.1 10*3/uL (ref 0.9–3.3)
LYMPH%: 32.2 % (ref 14.0–49.7)
MCH: 31.9 pg (ref 25.1–34.0)
MCHC: 32.6 g/dL (ref 31.5–36.0)
MCV: 97.8 fL (ref 79.5–101.0)
MONO#: 0.4 10*3/uL (ref 0.1–0.9)
MONO%: 10.7 % (ref 0.0–14.0)
NEUT%: 51.5 % (ref 38.4–76.8)
NEUTROS ABS: 1.7 10*3/uL (ref 1.5–6.5)
Platelets: 285 10*3/uL (ref 145–400)
RBC: 3.61 10*6/uL — AB (ref 3.70–5.45)
RDW: 12.8 % (ref 11.2–14.5)
RETIC %: 1.49 % (ref 0.70–2.10)
RETIC CT ABS: 53.79 10*3/uL (ref 33.70–90.70)
WBC: 3.4 10*3/uL — AB (ref 3.9–10.3)

## 2013-12-22 LAB — IRON AND TIBC CHCC
%SAT: 40 % (ref 21–57)
IRON: 122 ug/dL (ref 41–142)
TIBC: 301 ug/dL (ref 236–444)
UIBC: 180 ug/dL (ref 120–384)

## 2013-12-22 LAB — FERRITIN CHCC: Ferritin: 49 ng/ml (ref 9–269)

## 2013-12-24 ENCOUNTER — Encounter: Payer: Self-pay | Admitting: Hematology and Oncology

## 2013-12-24 ENCOUNTER — Ambulatory Visit (HOSPITAL_BASED_OUTPATIENT_CLINIC_OR_DEPARTMENT_OTHER): Payer: Medicare Other | Admitting: Hematology and Oncology

## 2013-12-24 VITALS — BP 112/57 | HR 71 | Temp 97.5°F | Resp 18 | Ht 66.0 in | Wt 110.6 lb

## 2013-12-24 DIAGNOSIS — D509 Iron deficiency anemia, unspecified: Secondary | ICD-10-CM

## 2013-12-24 DIAGNOSIS — D72819 Decreased white blood cell count, unspecified: Secondary | ICD-10-CM

## 2013-12-24 DIAGNOSIS — D649 Anemia, unspecified: Secondary | ICD-10-CM

## 2013-12-24 NOTE — Progress Notes (Signed)
[facility] OFFICE PROGRESS NOTE  WHITE,Joanna S, MD DIAGNOSIS:  Chronic anemia and mild leukopenia  SUMMARY OF HEMATOLOGIC HISTORY: This is a pleasant lady who had followup due to borderline anemia and iron deficiency. She was placed on oral iron supplement. She had extensive work up and overall cause of anemia was due to anemia chronic disease with borderline iron deficiency. INTERVAL HISTORY: Joanna Watkins 68 y.o. female returns for further followup. She tolerated oral iron supplement twice a day without problems. The patient denies any recent signs or symptoms of bleeding such as spontaneous epistaxis, hematuria or hematochezia. She denies symptoms of anemia. No recent infection.  I have reviewed the past medical history, past surgical history, social history and family history with the patient and they are unchanged from previous note.  ALLERGIES:  is allergic to vicodin.  MEDICATIONS:  Current Outpatient Prescriptions  Medication Sig Dispense Refill  . calcium-vitamin D (OSCAL WITH D) 500-200 MG-UNIT per tablet Take 1 tablet by mouth 2 (two) times daily.      . Cholecalciferol (VITAMIN D) 2000 UNITS CAPS Take 1 capsule by mouth 2 (two) times daily.      . ferrous sulfate dried (SLOW FE) 160 (50 FE) MG TBCR SR tablet Take 160 mg by mouth 2 (two) times daily with a meal.      . magnesium oxide (MAG-OX) 400 MG tablet Take 400 mg by mouth daily.      . Multiple Vitamins-Minerals (ICAPS) CAPS Take 1 capsule by mouth 2 (two) times daily.       No current facility-administered medications for this visit.     REVIEW OF SYSTEMS:   Constitutional: Denies fevers, chills or night sweats Eyes: Denies blurriness of vision Ears, nose, mouth, throat, and face: Denies mucositis or sore throat Respiratory: Denies cough, dyspnea or wheezes Cardiovascular: Denies palpitation, chest discomfort or lower extremity swelling Gastrointestinal:  Denies nausea, heartburn or change in  bowel habits Skin: Denies abnormal skin rashes Lymphatics: Denies new lymphadenopathy or easy bruising Neurological:Denies numbness, tingling or new weaknesses Behavioral/Psych: Mood is stable, no new changes  All other systems were reviewed with the patient and are negative.  PHYSICAL EXAMINATION: ECOG PERFORMANCE STATUS: 0 - Asymptomatic  Filed Vitals:   12/24/13 0921  BP: 112/57  Pulse: 71  Temp: 97.5 F (36.4 C)  Resp: 18   Filed Weights   12/24/13 0921  Weight: 110 lb 9.6 oz (50.168 kg)    GENERAL:alert, no distress and comfortable SKIN: skin color, texture, turgor are normal, no rashes or significant lesions EYES: normal, Conjunctiva are pink and non-injected, sclera clear Musculoskeletal:no cyanosis of digits and no clubbing  NEURO: alert & oriented x 3 with fluent speech, no focal motor/sensory deficits  LABORATORY DATA:  I have reviewed the data as listed No results found for this or any previous visit (from the past 48 hour(Watkins)).  Lab Results  Component Value Date   WBC 3.4* 12/22/2013   HGB 11.5* 12/22/2013   HCT 35.3 12/22/2013   MCV 97.8 12/22/2013   PLT 285 12/22/2013   ASSESSMENT & PLAN:  #1 anemia This is likely anemia of chronic disease. The patient denies recent history of bleeding such as epistaxis, hematuria or hematochezia. She is asymptomatic from the anemia. We will observe for now.  She does not require transfusion now.  There is a component of iron deficiency based on her ferritin level drawn several years ago.  Her repeat ferritin is close to 50. I would like  her ferritin level to be close to 100. #2 mild leukopenia Her white blood cell count fluctuated over the years. I suspect this is benign etiology. Continue observation.  All questions were answered. The patient knows to call the clinic with any problems, questions or concerns. No barriers to learning was detected.  I spent 15 minutes counseling the patient face to face. The total time spent  in the appointment was 20 minutes and more than 50% was on counseling.     Artis DelayNi Mahmood Boehringer, MD 12/24/2013 10:45 AM

## 2013-12-25 ENCOUNTER — Telehealth: Payer: Self-pay | Admitting: Hematology and Oncology

## 2013-12-25 NOTE — Telephone Encounter (Signed)
lvm for pt regarding to May 2016 appt....mailed pt appt sched/avs and letter °

## 2014-12-02 ENCOUNTER — Telehealth: Payer: Self-pay | Admitting: Hematology and Oncology

## 2014-12-02 NOTE — Telephone Encounter (Signed)
pt called to r/s 5.11 time to later...done...pt ok and aware of d.t

## 2014-12-23 ENCOUNTER — Other Ambulatory Visit: Payer: Medicare Other

## 2014-12-23 ENCOUNTER — Other Ambulatory Visit (HOSPITAL_BASED_OUTPATIENT_CLINIC_OR_DEPARTMENT_OTHER): Payer: Medicare Other

## 2014-12-23 DIAGNOSIS — D649 Anemia, unspecified: Secondary | ICD-10-CM | POA: Diagnosis not present

## 2014-12-23 DIAGNOSIS — D509 Iron deficiency anemia, unspecified: Secondary | ICD-10-CM

## 2014-12-23 LAB — CBC & DIFF AND RETIC
BASO%: 0.6 % (ref 0.0–2.0)
BASOS ABS: 0 10*3/uL (ref 0.0–0.1)
EOS%: 2.1 % (ref 0.0–7.0)
Eosinophils Absolute: 0.1 10*3/uL (ref 0.0–0.5)
HEMATOCRIT: 34.7 % — AB (ref 34.8–46.6)
HEMOGLOBIN: 11.5 g/dL — AB (ref 11.6–15.9)
IMMATURE RETIC FRACT: 2.3 % (ref 1.60–10.00)
LYMPH%: 28.6 % (ref 14.0–49.7)
MCH: 32.7 pg (ref 25.1–34.0)
MCHC: 33.1 g/dL (ref 31.5–36.0)
MCV: 98.6 fL (ref 79.5–101.0)
MONO#: 0.5 10*3/uL (ref 0.1–0.9)
MONO%: 10.1 % (ref 0.0–14.0)
NEUT%: 58.6 % (ref 38.4–76.8)
NEUTROS ABS: 2.8 10*3/uL (ref 1.5–6.5)
Platelets: 272 10*3/uL (ref 145–400)
RBC: 3.52 10*6/uL — ABNORMAL LOW (ref 3.70–5.45)
RDW: 12.9 % (ref 11.2–14.5)
Retic %: 1.4 % (ref 0.70–2.10)
Retic Ct Abs: 49.28 10*3/uL (ref 33.70–90.70)
WBC: 4.8 10*3/uL (ref 3.9–10.3)
lymph#: 1.4 10*3/uL (ref 0.9–3.3)

## 2014-12-24 LAB — FERRITIN CHCC: FERRITIN: 50 ng/mL (ref 9–269)

## 2014-12-30 ENCOUNTER — Telehealth: Payer: Self-pay | Admitting: Hematology and Oncology

## 2014-12-30 ENCOUNTER — Encounter: Payer: Self-pay | Admitting: Hematology and Oncology

## 2014-12-30 ENCOUNTER — Ambulatory Visit (HOSPITAL_BASED_OUTPATIENT_CLINIC_OR_DEPARTMENT_OTHER): Payer: Medicare Other | Admitting: Hematology and Oncology

## 2014-12-30 VITALS — BP 130/62 | HR 63 | Temp 97.8°F | Resp 18 | Ht 66.0 in | Wt 113.0 lb

## 2014-12-30 DIAGNOSIS — D509 Iron deficiency anemia, unspecified: Secondary | ICD-10-CM | POA: Diagnosis not present

## 2014-12-30 NOTE — Telephone Encounter (Signed)
Gave and printed papt sched and avs for pt May 2017..the patient is sched with Dr. Evette CristalGanem on 6.1 @ 1:15pm

## 2014-12-31 ENCOUNTER — Telehealth: Payer: Self-pay | Admitting: Hematology and Oncology

## 2014-12-31 NOTE — Telephone Encounter (Signed)
Faxed pt medical records to Dr. Ganem [phone]

## 2014-12-31 NOTE — Assessment & Plan Note (Signed)
It is puzzling that despite taking 2 tablets of iron a day, she barely have improvement of anemia and iron level. Recommend GI consult and she agreed.

## 2014-12-31 NOTE — Progress Notes (Signed)
[facility] OFFICE PROGRESS NOTE  WHITE,[name], MD SUMMARY OF HEMATOLOGIC HISTORY:  This is a pleasant lady who had followup due to borderline anemia and iron deficiency. She was placed on oral iron supplement. She had extensive work up and overall cause of anemia was due to anemia chronic disease with borderline iron deficiency. INTERVAL HISTORY: Joanna Watkins 69 y.o. female returns for further follow-up. She has been taking 2 oral iron supplement per day. The patient denies any recent signs or symptoms of bleeding such as spontaneous epistaxis, hematuria or hematochezia.   I have reviewed the past medical history, past surgical history, social history and family history with the patient and they are unchanged from previous note.  ALLERGIES:  is allergic to vicodin.  MEDICATIONS:  Current Outpatient Prescriptions  Medication Sig Dispense Refill  . brimonidine (ALPHAGAN) 0.2 % ophthalmic solution   1  . calcium-vitamin D (OSCAL WITH D) 500-200 MG-UNIT per tablet Take 1 tablet by mouth 2 (two) times daily.    . Cholecalciferol (VITAMIN D) 2000 UNITS CAPS Take 1 capsule by mouth 2 (two) times daily.    . DUREZOL 0.05 % EMUL     . ferrous sulfate dried (SLOW FE) 160 (50 FE) MG TBCR SR tablet Take 160 mg by mouth 2 (two) times daily with a meal.    . ketorolac (ACULAR) 0.5 % ophthalmic solution     . magnesium oxide (MAG-OX) 400 MG tablet Take 400 mg by mouth daily.    . Multiple Vitamins-Minerals (ICAPS) CAPS Take 1 capsule by mouth 2 (two) times daily.    Marland Kitchen. ofloxacin (OCUFLOX) 0.3 % ophthalmic solution      No current facility-administered medications for this visit.     REVIEW OF SYSTEMS:   Constitutional: Denies fevers, chills or night sweats Eyes: Denies blurriness of vision Ears, nose, mouth, throat, and face: Denies mucositis or sore throat Respiratory: Denies cough, dyspnea or wheezes Cardiovascular: Denies palpitation, chest discomfort or lower extremity  swelling Gastrointestinal:  Denies nausea, heartburn or change in bowel habits Skin: Denies abnormal skin rashes Lymphatics: Denies new lymphadenopathy or easy bruising Neurological:Denies numbness, tingling or new weaknesses Behavioral/Psych: Mood is stable, no new changes  All other systems were reviewed with the patient and are negative.  PHYSICAL EXAMINATION: ECOG PERFORMANCE STATUS: 0 - Asymptomatic  Filed Vitals:   12/30/14 0901  BP: 130/62  Pulse: 63  Temp: 97.8 F (36.6 C)  Resp: 18   Filed Weights   12/30/14 0901  Weight: 113 lb (51.256 kg)    GENERAL:alert, no distress and comfortable SKIN: skin color, texture, turgor are normal, no rashes or significant lesions EYES: normal, Conjunctiva are pink and non-injected, sclera clear OROPHARYNX:no exudate, no erythema and lips, buccal mucosa, and tongue normal  NECK: supple, thyroid normal size, non-tender, without nodularity LYMPH:  no palpable lymphadenopathy in the cervical, axillary or inguinal LUNGS: clear to auscultation and percussion with normal breathing effort HEART: regular rate & rhythm and no murmurs and no lower extremity edema ABDOMEN:abdomen soft, non-tender and normal bowel sounds Musculoskeletal:no cyanosis of digits and no clubbing  NEURO: alert & oriented x 3 with fluent speech, no focal motor/sensory deficits  LABORATORY DATA:  I have reviewed the data as listed No results found for this or any previous visit (from the past 48 hour(s)).  Lab Results  Component Value Date   WBC 4.8 12/23/2014   HGB 11.5* 12/23/2014   HCT 34.7* 12/23/2014   MCV 98.6 12/23/2014  PLT 272 12/23/2014    ASSESSMENT & PLAN:  Iron deficiency anemia It is puzzling that despite taking 2 tablets of iron a day, she barely have improvement of anemia and iron level. Recommend GI consult and she agreed.    All questions were answered. The patient knows to call the clinic with any problems, questions or concerns. No  barriers to learning was detected.  I spent 15 minutes counseling the patient face to face. The total time spent in the appointment was 20 minutes and more than 50% was on counseling.     [facility], Jourdan Durbin, MD 5/19/201611:26 AM

## 2015-02-08 ENCOUNTER — Other Ambulatory Visit: Payer: Self-pay

## 2015-12-30 ENCOUNTER — Encounter: Payer: Self-pay | Admitting: *Deleted

## 2015-12-30 ENCOUNTER — Encounter: Payer: Self-pay | Admitting: Hematology and Oncology

## 2015-12-30 ENCOUNTER — Other Ambulatory Visit: Payer: Medicare Other

## 2015-12-30 ENCOUNTER — Ambulatory Visit: Payer: Medicare Other | Admitting: Hematology and Oncology

## 2015-12-30 NOTE — Progress Notes (Signed)
Pt did not show up for her appts today.  No Show letter placed in outgoing mail to pt's home address.

## 2016-01-02 ENCOUNTER — Encounter: Payer: Self-pay | Admitting: Hematology and Oncology

## 2016-01-05 ENCOUNTER — Telehealth: Payer: Self-pay | Admitting: Hematology and Oncology

## 2016-01-05 NOTE — Telephone Encounter (Signed)
lvm for pt regarding to 6.8 appt....the patient ok and aware

## 2016-01-20 ENCOUNTER — Encounter: Payer: Self-pay | Admitting: Hematology and Oncology

## 2016-01-20 ENCOUNTER — Other Ambulatory Visit (HOSPITAL_BASED_OUTPATIENT_CLINIC_OR_DEPARTMENT_OTHER): Payer: Medicare Other

## 2016-01-20 ENCOUNTER — Ambulatory Visit (HOSPITAL_BASED_OUTPATIENT_CLINIC_OR_DEPARTMENT_OTHER): Payer: Medicare Other | Admitting: Hematology and Oncology

## 2016-01-20 VITALS — BP 117/52 | HR 62 | Temp 97.7°F | Resp 18 | Ht 66.0 in | Wt 112.1 lb

## 2016-01-20 DIAGNOSIS — D509 Iron deficiency anemia, unspecified: Secondary | ICD-10-CM

## 2016-01-20 DIAGNOSIS — D72819 Decreased white blood cell count, unspecified: Secondary | ICD-10-CM | POA: Diagnosis not present

## 2016-01-20 LAB — CBC & DIFF AND RETIC
BASO%: 1.4 % (ref 0.0–2.0)
Basophils Absolute: 0.1 10*3/uL (ref 0.0–0.1)
EOS ABS: 0.1 10*3/uL (ref 0.0–0.5)
EOS%: 2.8 % (ref 0.0–7.0)
HCT: 36.9 % (ref 34.8–46.6)
HEMOGLOBIN: 12.3 g/dL (ref 11.6–15.9)
IMMATURE RETIC FRACT: 1.7 % (ref 1.60–10.00)
LYMPH#: 1.3 10*3/uL (ref 0.9–3.3)
LYMPH%: 37.9 % (ref 14.0–49.7)
MCH: 32.7 pg (ref 25.1–34.0)
MCHC: 33.3 g/dL (ref 31.5–36.0)
MCV: 98.1 fL (ref 79.5–101.0)
MONO#: 0.5 10*3/uL (ref 0.1–0.9)
MONO%: 13.6 % (ref 0.0–14.0)
NEUT%: 44.3 % (ref 38.4–76.8)
NEUTROS ABS: 1.6 10*3/uL (ref 1.5–6.5)
Platelets: 255 10*3/uL (ref 145–400)
RBC: 3.76 10*6/uL (ref 3.70–5.45)
RDW: 13 % (ref 11.2–14.5)
RETIC %: 1.04 % (ref 0.70–2.10)
RETIC CT ABS: 39.1 10*3/uL (ref 33.70–90.70)
WBC: 3.5 10*3/uL — AB (ref 3.9–10.3)

## 2016-01-20 LAB — FERRITIN: FERRITIN: 109 ng/mL (ref 9–269)

## 2016-01-20 NOTE — Assessment & Plan Note (Signed)
She has chronic intermittent iron deficiency anemia, resolved with iron supplement. She is not symptomatic and tolerated iron supplements well. She was seen by GI who felt that repeat colonoscopy is not necessary. I will discharge the patient from the clinic and recommend close follow-up with primary care doctor for future blood count monitoring

## 2016-01-20 NOTE — Assessment & Plan Note (Signed)
She has chronic intermittent leukopenia of unknown etiology. She is not symptomatic. Observe only. No further workup is necessary.

## 2016-01-20 NOTE — Progress Notes (Signed)
[facility] OFFICE PROGRESS NOTE  WHITE,Joanna S, MD SUMMARY OF HEMATOLOGIC HISTORY: This is a pleasant lady who had followup due to borderline anemia and iron deficiency. She was placed on oral iron supplement. She had extensive work up and overall cause of anemia was due to anemia chronic disease with borderline iron deficiency. INTERVAL HISTORY: Joanna Watkins returns for further follow-up. She has been taking 2 oral iron supplement per day. The patient denies any recent signs or symptoms of bleeding such as spontaneous epistaxis, hematuria or hematochezia. She denies recent infection.  I have reviewed the past medical history, past surgical history, social history and family history with the patient and they are unchanged from previous note.  ALLERGIES:  is allergic to vicodin.  MEDICATIONS:  Current Outpatient Prescriptions  Medication Sig Dispense Refill  . calcium-vitamin D (OSCAL WITH D) 500-200 MG-UNIT per tablet Take 1 tablet by mouth 2 (two) times daily.    . Cholecalciferol (VITAMIN D) 2000 UNITS CAPS Take 1 capsule by mouth 2 (two) times daily.    . ferrous sulfate dried (SLOW FE) 160 (50 FE) MG TBCR SR tablet Take 160 mg by mouth 2 (two) times daily with a meal.    . magnesium oxide (MAG-OX) 400 MG tablet Take 400 mg by mouth daily.    . Multiple Vitamins-Minerals (ICAPS) CAPS Take 1 capsule by mouth 2 (two) times daily.     No current facility-administered medications for this visit.     REVIEW OF SYSTEMS:   Constitutional: Denies fevers, chills or night sweats Eyes: Denies blurriness of vision Ears, nose, mouth, throat, and face: Denies mucositis or sore throat Respiratory: Denies cough, dyspnea or wheezes Cardiovascular: Denies palpitation, chest discomfort or lower extremity swelling Gastrointestinal:  Denies nausea, heartburn or change in bowel habits Skin: Denies abnormal skin rashes Lymphatics: Denies new lymphadenopathy or easy  bruising Neurological:Denies numbness, tingling or new weaknesses Behavioral/Psych: Mood is stable, no new changes  All other systems were reviewed with the patient and are negative.  PHYSICAL EXAMINATION: ECOG PERFORMANCE STATUS: 0 - Asymptomatic  Filed Vitals:   01/20/16 0906  BP: 117/52  Pulse: 62  Temp: 97.7 F (36.5 C)  Resp: 18   Filed Weights   01/20/16 0906  Weight: 112 lb 1.6 oz (50.848 kg)    GENERAL:alert, no distress and comfortable SKIN: skin color, texture, turgor are normal, no rashes or significant lesions EYES: normal, Conjunctiva are pink and non-injected, sclera clear Musculoskeletal:no cyanosis of digits and no clubbing  NEURO: alert & oriented x 3 with fluent speech, no focal motor/sensory deficits  LABORATORY DATA:  I have reviewed the data as listed     Component Value Date/Time   NA 141 04/24/2013 1211   NA 141 09/25/2012 1348   K 4.3 04/24/2013 1211   K 4.0 09/25/2012 1348   CL 105 04/24/2013 1211   CL 104 09/25/2012 1348   CO2 26 04/24/2013 1211   CO2 28 09/25/2012 1348   GLUCOSE 100* 04/24/2013 1211   GLUCOSE 125* 09/25/2012 1348   BUN 13 04/24/2013 1211   BUN 22.7 09/25/2012 1348   CREATININE 0.72 04/24/2013 1211   CREATININE 0.8 09/25/2012 1348   CALCIUM 9.8 04/24/2013 1211   CALCIUM 9.4 09/25/2012 1348   PROT 7.2 04/24/2013 1211   PROT 7.5 09/25/2012 1348   ALBUMIN 3.8 04/24/2013 1211   ALBUMIN 3.8 09/25/2012 1348   AST 24 04/24/2013 1211   AST 29 09/25/2012 1348   ALT 22 04/24/2013 1211  ALT 31 09/25/2012 1348   ALKPHOS 60 04/24/2013 1211   ALKPHOS 74 09/25/2012 1348   BILITOT 0.7 04/24/2013 1211   BILITOT 0.69 09/25/2012 1348   GFRNONAA 87* 04/24/2013 1211   GFRAA >90 04/24/2013 1211    No results found for: SPEP, UPEP  Lab Results  Component Value Date   WBC 3.5* 01/20/2016   NEUTROABS 1.6 01/20/2016   HGB 12.3 01/20/2016   HCT 36.9 01/20/2016   MCV 98.1 01/20/2016   PLT 255 01/20/2016      Chemistry       Component Value Date/Time   NA 141 04/24/2013 1211   NA 141 09/25/2012 1348   K 4.3 04/24/2013 1211   K 4.0 09/25/2012 1348   CL 105 04/24/2013 1211   CL 104 09/25/2012 1348   CO2 26 04/24/2013 1211   CO2 28 09/25/2012 1348   BUN 13 04/24/2013 1211   BUN 22.7 09/25/2012 1348   CREATININE 0.72 04/24/2013 1211   CREATININE 0.8 09/25/2012 1348      Component Value Date/Time   CALCIUM 9.8 04/24/2013 1211   CALCIUM 9.4 09/25/2012 1348   ALKPHOS 60 04/24/2013 1211   ALKPHOS 74 09/25/2012 1348   AST 24 04/24/2013 1211   AST 29 09/25/2012 1348   ALT 22 04/24/2013 1211   ALT 31 09/25/2012 1348   BILITOT 0.7 04/24/2013 1211   BILITOT 0.69 09/25/2012 1348      ASSESSMENT & PLAN:  Iron deficiency anemia She has chronic intermittent iron deficiency anemia, resolved with iron supplement. She is not symptomatic and tolerated iron supplements well. She was seen by GI who felt that repeat colonoscopy is not necessary. I will discharge the patient from the clinic and recommend close follow-up with primary care doctor for future blood count monitoring  Leukopenia She has chronic intermittent leukopenia of unknown etiology. She is not symptomatic. Observe only. No further workup is necessary.   All questions were answered. The patient knows to call the clinic with any problems, questions or concerns. No barriers to learning was detected.  I spent 10 minutes counseling the patient face to face. The total time spent in the appointment was 15 minutes and more than 50% was on counseling.     [facility] FallsGORSUCH, Boyd Litaker, MD 6/8/201711:00 AM

## 2016-01-31 ENCOUNTER — Ambulatory Visit: Payer: Medicare Other | Attending: Family Medicine | Admitting: Physical Therapy

## 2016-01-31 DIAGNOSIS — H8111 Benign paroxysmal vertigo, right ear: Secondary | ICD-10-CM | POA: Diagnosis present

## 2016-01-31 DIAGNOSIS — H8112 Benign paroxysmal vertigo, left ear: Secondary | ICD-10-CM | POA: Diagnosis present

## 2016-01-31 DIAGNOSIS — R42 Dizziness and giddiness: Secondary | ICD-10-CM | POA: Diagnosis present

## 2016-01-31 NOTE — Therapy (Signed)
[facility] [address] [place], [place], [address] Phone: [phone]   Fax:  [phone]  Physical Therapy Evaluation  Patient Details  Name: Serita KyleMarilyn B Mcfarlan MRN: [id number] Date of Birth: Jan 06, 1946 Referring Provider: Laurann Montanaynthia White MD  Encounter Date: 01/31/2016      PT End of Session - 01/31/16 1336    Visit Number 1   Number of Visits 7  eval + 6 visits   Date for PT Re-Evaluation 03/31/16   Authorization Type [employer] Medicare- G codes   PT Start Time 0847   PT Stop Time 0928   PT Time Calculation (min) 41 min   Activity Tolerance Patient tolerated treatment well   Behavior During Therapy [facility] for tasks assessed/performed      Past Medical History  Diagnosis Date  . Osteopenia   . Allergy history unknown   . Anemia   . Iron deficiency anemia 06/03/2012    Past Surgical History  Procedure Laterality Date  . Appendectomy  12/2009  . C4 fracture      There were no vitals filed for this visit.       Subjective Assessment - 01/31/16 1316    Subjective Pt is a 70 yr old female referred for vertigo. She had experienced "vertigo" last October but feels this time the vertigo is different. Describes this time as not as much room spinning but more dizziness   Pertinent History Osteopenia, anemia, bilateral Cataract sx October 2016   Limitations Standing;House hold activities;Walking   Patient Stated Goals Leaning back and not passing out    Currently in Pain? No/denies            [facility] PT Assessment - 01/31/16 0800    Assessment   Medical Diagnosis Vertigo   Referring Provider Laurann Montanaynthia White MD   Onset Date/Surgical Date 05/15/15   Precautions   Precautions None   Restrictions   Weight Bearing Restrictions No   Balance Screen   Has the patient fallen in the past 6 months No   Home Environment   Living Environment Private residence   Living Arrangements Spouse/significant other   Available Help  at Discharge Family   Type of Home House   Home Access Stairs to enter   Entrance Stairs-Number of Steps 1   Entrance Stairs-Rails None   Home Layout One level   Home Equipment None   Prior Function   Level of Independence Independent   Vocation Retired   Leisure Psychologist, educationally fishing            Vestibular Assessment - 01/31/16 0001    Symptom Behavior   Type of Dizziness Unsteady with head/body turns  lightheadness   Frequency of Dizziness daily, multiple times a day   Duration of Dizziness 15s    Aggravating Factors Turning head sideways;Sitting with head tilted back;Turning head quickly   Relieving Factors --  fixing gaze on something   Occulomotor Exam   Occulomotor Alignment Normal   Spontaneous Absent   Gaze-induced Absent   Smooth Pursuits Intact   Saccades Intact   Comment Convergence appears abnormal; however, pt reports likely need for additional procedure to address failed cataract extraction (05/2015).   Vestibulo-Occular Reflex   Comment Head Thrust: Normal bilaterally    Positional Testing   Dix-Hallpike Dix-Hallpike Left   Sidelying Test Sidelying Left;Sidelying Right   Horizontal Canal Testing Horizontal Canal Right;Horizontal Canal Left   Dix-Hallpike Left   Dix-Hallpike Left Duration Latancy: 5s, Duration: 10s   Dix-Hallpike  Left Symptoms Upbeat, left rotatory nystagmus   Sidelying Right   Sidelying Right Duration NA   Sidelying Right Symptoms No nystagmus   Sidelying Left   Sidelying Left Duration Latancy: 5s, Duration: Approximately 10s-15s   Sidelying Left Symptoms Upbeat, left rotatory nystagmus   Horizontal Canal Right   Horizontal Canal Right Symptoms Normal   Horizontal Canal Left   Horizontal Canal Left Symptoms Normal                Vestibular Treatment/Exercise - 2016-02-26 0001    Vestibular Treatment/Exercise   Vestibular Treatment Provided Canalith Repositioning   Canalith Repositioning Epley Manuever Left    EPLEY MANUEVER LEFT    Number of Reps  1   Overall Response  Improved Symptoms    RESPONSE DETAILS LEFT Re testing of Dix Hallpike to L showed no nystagmus or symptom complaints per patient               PT Education - 2016-02-26 1334    Education provided Yes   Education Details PT POC, semicircular canals and BPPV    Person(s) Educated Patient   Methods Explanation   Comprehension Verbalized understanding          PT Short Term Goals - 2016-02-26 1345    PT SHORT TERM GOAL #1   Title STG=LTG           PT Long Term Goals - 02/26/16 1350    PT LONG TERM GOAL #1   Title Postional testing for BPPV will be negative to indicate resolved of vertigo. (Target Date: 02/28/16)   Status New   PT LONG TERM GOAL #2   Title Pt will increase DHI score from 14 to 0 to indicate decreased percieved dizziness (Target Date: 02/28/16)   Status New   PT LONG TERM GOAL #3   Title Pt will independently demo vestibular HEP to maximize functional gains made in PT. (Target Date: 02/28/16)   Status New               Plan - 2016-02-26 1343    Clinical Impression Statement Pt is a 70 yr old female here for vertigo. PMH: Osteopenia, Anemia, Bilateral Catacts sx October 2016. Pt reports increased dizziness with turning head and looking up. During eval pt had left upbeating nystamus with Left sidelying test and Left Dix hallpike, nystamus latency approximately 5s and duration approximately 10-15s.  Signs and symptoms consent with L posterior canalithisis BPPV. Pt was treated with L Epley Manevour. Reassessment of L posterior canal negative for nystamus and symptoms. Pt will benefit from vestibular PT 2x/week for 2 weeks followed by 1x/week for 2 weeks to address said impairments.    Rehab Potential Good   PT Frequency --  2x a week for 2 weeks, 1x a week for 2 weeks   PT Treatment/Interventions Vestibular;Therapeutic exercise;Therapeutic activities;Patient/family education;Neuromuscular re-education;Balance  training;Canalith Repostioning;ADLs/Self Care Home Management;Gait training;Stair training;Functional mobility training;Manual techniques   PT Next Visit Plan Reassess L posterior canal BPPV and treat prn. Initiate habituation and/or corner balance HEP, as appropriate.    Consulted and Agree with Plan of Care Patient      Patient will benefit from skilled therapeutic intervention in order to improve the following deficits and impairments:  Dizziness  Visit Diagnosis: BPPV (benign paroxysmal positional vertigo), left - Plan: PT plan of care cert/re-cert  Dizziness and giddiness - Plan: PT plan of care cert/re-cert      G-Codes - 26-Feb-2016 1705    Functional Assessment  Tool Used DHI = 14   Functional Limitation Self care   Self Care Current Status [id number]) At least 1 percent but less than 20 percent impaired, limited or restricted   Self Care Goal Status (U0454) At least 1 percent but less than 20 percent impaired, limited or restricted       Problem List Patient Active Problem List   Diagnosis Date Noted  . Leukopenia 01/20/2016  . Iron deficiency anemia 06/03/2012  . Anemia   . Osteopenia     Rollene Fare, SPT 01/31/2016, 5:08 PM  [facility] [address] [place], [place], [address] Phone: [phone]   Fax:  [phone]  Name: CHERISA BRUCKER MRN: [id number] Date of Birth: [birth year]

## 2016-02-01 ENCOUNTER — Encounter: Payer: Medicare Other | Admitting: Physical Therapy

## 2016-02-02 ENCOUNTER — Ambulatory Visit: Payer: Medicare Other | Admitting: Physical Therapy

## 2016-02-02 DIAGNOSIS — H8112 Benign paroxysmal vertigo, left ear: Secondary | ICD-10-CM

## 2016-02-02 DIAGNOSIS — H8111 Benign paroxysmal vertigo, right ear: Secondary | ICD-10-CM

## 2016-02-02 DIAGNOSIS — R42 Dizziness and giddiness: Secondary | ICD-10-CM

## 2016-02-02 NOTE — Patient Instructions (Signed)
Tip Card 1.The goal of habituation training is to assist in decreasing symptoms of vertigo, dizziness, or nausea provoked by specific head and body motions. 2.These exercises may initially increase symptoms; however, be persistent and work through symptoms. With repetition and time, the exercises will assist in reducing or eliminating symptoms. 3.Exercises should be stopped and discussed with the therapist if you experience any of the following: - Sudden change or fluctuation in hearing - New onset of ringing in the ears, or increase in current intensity - Any fluid discharge from the ear - Severe pain in neck or back - Extreme nausea   Sit to Side-Lying   Sit on edge of bed. Lie down onto the right side and hold until dizziness stops, plus 20 seconds.  Return to sitting and wait until dizziness stops, plus 20 seconds.  Repeat to the left side. Repeat sequence 5 times per session. Do 2 sessions per day. 

## 2016-02-02 NOTE — Therapy (Signed)
[facility] [address] [place], [place], [address] Phone: [phone]   Fax:  [phone]  Physical Therapy Treatment  Patient Details  Name: Joanna Watkins MRN: [id number] Date of Birth: 02/23/1946 Referring Provider: Laurann Montana MD  Encounter Date: 02/02/2016      PT End of Session - 02/02/16 1717    Visit Number 2   Number of Visits 7   Date for PT Re-Evaluation 03/31/16   Authorization Type UHC Medicare- G codes   PT Start Time 1539   PT Stop Time 1618   PT Time Calculation (min) 39 min   Activity Tolerance Patient tolerated treatment well;Other (comment)  Rest breaks required between trials due to nausea   Behavior During Therapy [facility] for tasks assessed/performed      Past Medical History  Diagnosis Date  . Osteopenia   . Allergy history unknown   . Anemia   . Iron deficiency anemia 06/03/2012    Past Surgical History  Procedure Laterality Date  . Appendectomy  12/2009  . C4 fracture      There were no vitals filed for this visit.      Subjective Assessment - 02/02/16 1541    Subjective "I felt great...the vertigo was gone, until yesterday morning at 1 am...and I can hardly get up now."   Pertinent History Osteopenia, anemia, bilateral Cataract sx October 2016   Limitations Standing;House hold activities;Walking   Patient Stated Goals Leaning back and not passing out    Currently in Pain? Yes   Pain Score 8    Pain Location Head   Pain Orientation Other (Comment)  entire head; R eye   Pain Descriptors / Indicators Headache   Pain Type Acute pain   Pain Onset Yesterday   Pain Frequency Constant   Aggravating Factors  turning head   Pain Relieving Factors maintaining head stationary                Vestibular Assessment - 02/02/16 0001    Vestibulo-Occular Reflex   Comment Difficult to assess due to cervical spine muscle guarding; however, L appears (+). Symptomatic  bilaterally.   Positional Testing   Dix-Hallpike Dix-Hallpike Right   Dix-Hallpike Right   Dix-Hallpike Right Duration R-beating nystagmus x15-20 seconds visible only when gaze in direction of nystagmus. Pt reports nausea in test position but true vertigo only when gaze in direction of nystagmus.   Dix-Hallpike Right Symptoms Upbeat, right rotatory nystagmus   Dix-Hallpike Left   Dix-Hallpike Left Duration NA   Dix-Hallpike Left Symptoms No nystagmus   Sidelying Right   Sidelying Right Duration Assessed after R Epley x2 trials. L torsional nystagmus (appears downbeating but dificult to discern) x6-8 seconds.   Sidelying Right Symptoms Downbeat, left rotatory nystagmus   Sidelying Left   Sidelying Left Duration No nystagmus, but pt reports nausea   Sidelying Left Symptoms No nystagmus   Horizontal Canal Right   Horizontal Canal Right Duration NA   Horizontal Canal Right Symptoms Normal   Horizontal Canal Left   Horizontal Canal Left Duration NA   Horizontal Canal Left Symptoms Normal                  Vestibular Treatment/Exercise - 02/02/16 0001    Vestibular Treatment/Exercise   Vestibular Treatment Provided Canalith Repositioning;Habituation   Canalith Repositioning Epley Manuever Right;Epley Manuever Left   Habituation Exercises Brandt Daroff    EPLEY MANUEVER RIGHT   Number of Reps  2   Overall  Response Improved Symptoms   Response Details  Reassessment of R Sidelying Test reveals nystagmus approx. 6-8 seconds accompanied by mild symptoms. Gait stability much improved post-treatment.   Austin MilesBrandt Daroff   Number of Reps  2   Symptom Description  Minimal symptoms after 2nd trial of Austin MilesBrandt Daroff. Pt continues to report "lightheadedness" with both R and L supine > sit.               PT Education - 02/02/16 1643    Education provided Yes   Education Details HEP:  Austin MilesBrandt Daroff for habituation.   Person(s) Educated Patient   Methods  Explanation;Demonstration;Verbal cues;Handout   Comprehension Verbalized understanding;Returned demonstration          PT Short Term Goals - 01/31/16 1345    PT SHORT TERM GOAL #1   Title STG=LTG           PT Long Term Goals - 01/31/16 1350    PT LONG TERM GOAL #1   Title Postional testing for BPPV will be negative to indicate resolved of vertigo. (Target Date: 02/28/16)   Status New   PT LONG TERM GOAL #2   Title Pt will increase DHI score from 14 to 0 to indicate decreased percieved dizziness (Target Date: 02/28/16)   Status New   PT LONG TERM GOAL #3   Title Pt will independently demo vestibular HEP to maximize functional gains made in PT. (Target Date: 02/28/16)   Status New               Plan - 02/02/16 1718    Clinical Impression Statement Reassessment for BPPV reveals R upbeating torsional nystagmus x10-15 seconds. Therefore, treated with R Epley Maneuver x1. Reassessment of R Sidelying Test then revealed L torsional nystagmus which appeared to be downbeating but was very difficult to visualize. Treated with additional R Epley maneuver, after which pt reported significant improvement in symptoms. Educated pt on [employer]t Daroff for habituation.    Rehab Potential Good   PT Frequency Other (comment)  2x/week for 2 weeks followed by 1x/week for 2 weeks   PT Treatment/Interventions Vestibular;Therapeutic exercise;Therapeutic activities;Patient/family education;Neuromuscular re-education;Balance training;Canalith Repostioning;ADLs/Self Care Home Management;Gait training;Stair training;Functional mobility training;Manual techniques   PT Next Visit Plan Assess for BPPV (started with bilat PC) and treat prn. Assess pt performance of [employer]Brandt Daroff; expand on HEP as appopropriate (x1 viewing, EC on foam)   Consulted and Agree with Plan of Care Patient      Patient will benefit from skilled therapeutic intervention in order to improve the following deficits and impairments:   Dizziness  Visit Diagnosis: BPPV (benign paroxysmal positional vertigo), left  BPPV (benign paroxysmal positional vertigo), right  Dizziness and giddiness     Problem List Patient Active Problem List   Diagnosis Date Noted  . Leukopenia 01/20/2016  . Iron deficiency anemia 06/03/2012  . Anemia   . Osteopenia     Jorje GuildBlair Hobble, PT, DPT [facility] [address] [place], [place], [address] Phone: [phone]   Fax:  [phone] 02/02/2016, 5:25 PM  Name: Joanna Watkins MRN: [id number] Date of Birth: [birth year]

## 2016-02-04 ENCOUNTER — Encounter: Payer: Medicare Other | Admitting: Physical Therapy

## 2016-02-07 ENCOUNTER — Ambulatory Visit: Payer: Medicare Other | Admitting: Physical Therapy

## 2016-02-07 DIAGNOSIS — H8112 Benign paroxysmal vertigo, left ear: Secondary | ICD-10-CM | POA: Diagnosis not present

## 2016-02-07 DIAGNOSIS — H8111 Benign paroxysmal vertigo, right ear: Secondary | ICD-10-CM

## 2016-02-07 DIAGNOSIS — R42 Dizziness and giddiness: Secondary | ICD-10-CM

## 2016-02-07 NOTE — Therapy (Signed)
[facility] [address] [place], [place], [address] Phone: [phone]   Fax:  [phone]  Physical Therapy Treatment  Patient Details  Name: Joanna Watkins MRN: [id number] Date of Birth: [birth year] Referring Provider: Laurann Montanaynthia White MD  Encounter Date: 02/07/2016      PT End of Session - 02/07/16 0928    Visit Number 3   Number of Visits 7   Date for PT Re-Evaluation 03/31/16   Authorization Type UHC Medicare- G codes   PT Start Time 0800   PT Stop Time 0834  ended early due to no further treatment indicated at this time   PT Time Calculation (min) 34 min   Activity Tolerance Patient tolerated treatment well   Behavior During Therapy [facility] for tasks assessed/performed      Past Medical History  Diagnosis Date  . Osteopenia   . Allergy history unknown   . Anemia   . Iron deficiency anemia 06/03/2012    Past Surgical History  Procedure Laterality Date  . Appendectomy  12/2009  . C4 fracture      There were no vitals filed for this visit.      Subjective Assessment - 02/07/16 0803    Subjective "The exercises made me dizzy at first, then it got better. It still makes me dizzy to turn my head this way and this way (demonstrates right and left) and to bend over. Overall, much better though."   Pertinent History Osteopenia, anemia, bilateral Cataract sx October 2016   Limitations Standing;House hold activities;Walking   Patient Stated Goals Leaning back and not passing out    Currently in Pain? No/denies                Vestibular Assessment - 02/07/16 0001    Positional Testing   Sidelying Test Sidelying Right;Sidelying Left   Dix-Hallpike Right   Dix-Hallpike Right Duration Approx. 10-12 seconds of nystagmus accompanied by vertigo   Dix-Hallpike Right Symptoms Upbeat, right rotatory nystagmus   Sidelying Right   Sidelying Right Duration No nystagmus but lightheadedness < 10 seconds.   Sidelying Right Symptoms No nystagmus   Sidelying Left   Sidelying Left Duration NA   Sidelying Left Symptoms No nystagmus   Horizontal Canal Right   Horizontal Canal Right Duration Lightheadedness <5 seconds, but no true vertigo   Horizontal Canal Right Symptoms Normal   Horizontal Canal Left   Horizontal Canal Left Duration Lightheadedness <5 seconds, but no true vertigo   Horizontal Canal Left Symptoms Normal                 [facility] Adult PT Treatment/Exercise - 02/07/16 0001    Ambulation/Gait   Ambulation/Gait Yes   Ambulation/Gait Assistance 6: Modified independent (Device/Increase time);5: Supervision   Ambulation/Gait Assistance Details Mod I for linear gait without head turns. (S) for gait with functional vertical/horizontal head turns due to decreased gait stability.   Ambulation Distance (Feet) 220 Feet   Assistive device None   Gait Pattern Step-through pattern  turns en bloc; veers in direction of horizontal head turn         Vestibular Treatment/Exercise - 02/07/16 0001    Vestibular Treatment/Exercise   Vestibular Treatment Provided Canalith Repositioning;Habituation;Gaze   Canalith Repositioning Epley Manuever Right   Habituation Exercises Horizontal Roll   Gaze Exercises X1 Viewing Horizontal;X1 Viewing Vertical    EPLEY MANUEVER RIGHT   Number of Reps  2   Overall Response Improved Symptoms   Response Details  After second Epley maneuver, R Dix-Halpike (-) with no presence of vertigo   Horizontal Roll   Number of Reps  4   Symptom Description  Initially with lightheadedness when rollling    X1 Viewing Horizontal   Foot Position standing; shoulder width   Reps 2   Comments 2 x 30-sec trials   X1 Viewing Vertical   Foot Position standing; shoulder width   Reps 1   Comments 30-second trial            Balance Exercises - 02/07/16 0905    Balance Exercises: Standing   Standing Eyes Closed Wide (BOA);Foam/compliant surface;2 reps;30 secs  1  pillow; added to HEP           PT Education - 02/07/16 16100832    Education provided Yes   Education Details HEP: added horizontal rolling for habituation, X1 viewing, and EC on foam.   Person(s) Educated Patient   Methods Explanation;Demonstration;Verbal cues;Handout   Comprehension Verbalized understanding;Returned demonstration          PT Short Term Goals - 01/31/16 1345    PT SHORT TERM GOAL #1   Title STG=LTG           PT Long Term Goals - 01/31/16 1350    PT LONG TERM GOAL #1   Title Postional testing for BPPV will be negative to indicate resolved of vertigo. (Target Date: 02/28/16)   Status New   PT LONG TERM GOAL #2   Title Pt will increase DHI score from 14 to 0 to indicate decreased percieved dizziness (Target Date: 02/28/16)   Status New   PT LONG TERM GOAL #3   Title Pt will independently demo vestibular HEP to maximize functional gains made in PT. (Target Date: 02/28/16)   Status New               Plan - 02/07/16 0928    Clinical Impression Statement Reassessment of R Dix-Hallpike reveals R upbeating, torsional nystagmus. Unable to rule out R posterior canalithasis. Therefore, treated with R Epley x2 trials, after which R Dix-Hallpike was (-) and pt reported no vertigo. Recommended pt continue to perform Austin MilesBrandt Daroff for habituation. Added horizontal rolling for habituation to HEP due to motion sensitivity with rolling. Also added X1 viewing and corner balance with emphasis on vestibular reliance.    Rehab Potential Good   PT Frequency Other (comment)  2x/week for 2 weeks followed by 1x/week for 2 weeks   PT Treatment/Interventions Vestibular;Therapeutic exercise;Therapeutic activities;Patient/family education;Neuromuscular re-education;Balance training;Canalith Repostioning;ADLs/Self Care Home Management;Gait training;Stair training;Functional mobility training;Manual techniques   PT Next Visit Plan Assess for BPPV (started with bilat PC) and treat  prn. Assess motion sensitivity with Austin MilesBrandt Daroff and horziontal rolling. Assess remainder of vestib HEP and progress prn. Consider adding gait with head turns.   Consulted and Agree with Plan of Care Patient      Patient will benefit from skilled therapeutic intervention in order to improve the following deficits and impairments:  Dizziness  Visit Diagnosis: BPPV (benign paroxysmal positional vertigo), right  Dizziness and giddiness     Problem List Patient Active Problem List   Diagnosis Date Noted  . Leukopenia 01/20/2016  . Iron deficiency anemia 06/03/2012  . Anemia   . Osteopenia     Jorje GuildBlair Hobble, PT, DPT [facility] [address] [place], [place], [address] Phone: [phone]   Fax:  [phone] 02/07/2016, 1:09 PM  Name: Joanna Watkins MRN: [id number] Date of Birth: 09/17/1945

## 2016-02-07 NOTE — Patient Instructions (Signed)
Tip Card 1.The goal of habituation training is to assist in decreasing symptoms of vertigo, dizziness, or nausea provoked by specific head and body motions. 2.These exercises may initially increase symptoms; however, be persistent and work through symptoms. With repetition and time, the exercises will assist in reducing or eliminating symptoms. 3.Exercises should be stopped and discussed with the therapist if you experience any of the following: - Sudden change or fluctuation in hearing - New onset of ringing in the ears, or increase in current intensity - Any fluid discharge from the ear - Severe pain in neck or back - Extreme nausea  Copyright  VHI. All rights reserved.  Rolling   With pillow under head, start on back. Roll to your right side.  Hold until dizziness stops, plus 20 seconds and then roll to the left side.  Hold until dizziness stops, plus 20 seconds.  Repeat sequence 5 times per session. Do 2 sessions per day.  Copyright  VHI. All rights reserved.  Sit to Side-Lying   Sit on edge of bed. Lie down onto the right side and hold until dizziness stops, plus 20 seconds.  Return to sitting and wait until dizziness stops, plus 20 seconds.  Repeat to the left side. Repeat sequence 5 times per session. Do 2 sessions per day.  Copyright  VHI. All rights reserved.  Gaze Stabilization: Tip Card 1.Target must remain in focus, not blurry, and appear stationary while head is in motion. 2.Perform exercises with small head movements (45 to either side of midline). 3.Increase speed of head motion so long as target is in focus. 4.If you wear eyeglasses, be sure you can see target through lens (therapist will give specific instructions for bifocal / progressive lenses). 5.These exercises may provoke dizziness or nausea. Work through these symptoms. If too dizzy, slow head movement slightly. Rest between each exercise. 6.Exercises demand concentration; avoid distractions. 7.For safety,  perform standing exercises close to a counter, wall, corner, or next to someone.  Copyright  VHI. All rights reserved.  Gaze Stabilization: Standing Feet Apart   Feet shoulder width apart, keeping eyes on target on wall 3 feet away, tilt head down slightly and move head side to side for 30 seconds. Repeat while moving head up and down for 30 seconds. Do 2 sessions per day.   Balance: Eyes Closed - Bilateral (Varied Surfaces)    Stand with your back to a corner with a stable chair in front of you. Stand on a pillow with feet shoulder width apart, close eyes. Maintain balance __30__ seconds. Repeat __4__ times per set. Do __2__ sets per day.  Copyright  VHI. All rights reserved.

## 2016-02-10 ENCOUNTER — Ambulatory Visit: Payer: Medicare Other | Admitting: Physical Therapy

## 2016-02-10 DIAGNOSIS — H8112 Benign paroxysmal vertigo, left ear: Secondary | ICD-10-CM | POA: Diagnosis not present

## 2016-02-10 DIAGNOSIS — R42 Dizziness and giddiness: Secondary | ICD-10-CM

## 2016-02-10 NOTE — Therapy (Signed)
[facility] [address] [place], [place], [address] Phone: [phone]   Fax:  [phone]  Physical Therapy Treatment  Patient Details  Name: Joanna Watkins MRN: [id number] Date of Birth: 26-Jan-1946 Referring Provider: Laurann Montanaynthia White MD  Encounter Date: 02/10/2016      PT End of Session - 02/10/16 0941    Visit Number 4   Number of Visits 7   Date for PT Re-Evaluation 03/31/16   Authorization Type UHC Medicare- G codes   PT Start Time 0851   PT Stop Time 0929   PT Time Calculation (min) 38 min   Activity Tolerance Patient tolerated treatment well   Behavior During Therapy [facility] for tasks assessed/performed      Past Medical History  Diagnosis Date  . Osteopenia   . Allergy history unknown   . Anemia   . Iron deficiency anemia 06/03/2012    Past Surgical History  Procedure Laterality Date  . Appendectomy  12/2009  . C4 fracture      There were no vitals filed for this visit.      Subjective Assessment - 02/10/16 0855    Subjective "I still feel a little bit like I'm in [place] when I first get up in the morning. Looking down still bothers me."   Pertinent History Osteopenia, anemia, bilateral Cataract sx October 2016   Limitations Standing;House hold activities;Walking   Patient Stated Goals Leaning back and not passing out    Currently in Pain? No/denies                Vestibular Assessment - 02/10/16 0001    Dix-Hallpike Right   Dix-Hallpike Right Duration No nystagmus, but symptoms of lightheadedness   Dix-Hallpike Right Symptoms No nystagmus   Sidelying Right   Sidelying Right Duration No symptoms in R sidelying test, but lightheadedness with R sidelying > sit.   Sidelying Right Symptoms No nystagmus   Sidelying Left   Sidelying Left Duration NA   Sidelying Left Symptoms No nystagmus   Horizontal Canal Right   Horizontal Canal Right Duration NA   Horizontal Canal Right Symptoms  Normal   Horizontal Canal Left   Horizontal Canal Left Duration NA   Horizontal Canal Left Symptoms Normal   Positional Sensitivities   Nose to Right Knee Mild dizziness   Orthostatics   BP supine (x 5 minutes) 120/55 mmHg   HR supine (x 5 minutes) 52   BP standing (after 1 minute) 94/61 mmHg  "no more lightheadedness than what I came in with"   HR standing (after 1 minute) 68   BP standing (after 3 minutes) 115/70 mmHg   HR standing (after 3 minutes) 72                  Vestibular Treatment/Exercise - 02/10/16 0001    Vestibular Treatment/Exercise   Vestibular Treatment Provided Habituation   Habituation Exercises Comment  Nose to R knee x5 with no change in symptoms            Balance Exercises - 02/10/16 0937    Balance Exercises: Standing   Standing Eyes Closed Wide (BOA);Foam/compliant surface;2 reps;30 secs  reviewed HEP    Gait with Head Turns Forward;4 reps;Limitations  Horizontal- symptoms of unsteadiness, vertical- no symptoms           PT Education - 02/10/16 0942    Education provided Yes   Education Details HEP: progressed gaze stabilization, added gait with head turns; removed  habituation from HEP.    Person(s) Educated Patient   Methods Explanation;Demonstration;Handout   Comprehension Verbalized understanding;Returned demonstration          PT Short Term Goals - 01/31/16 1345    PT SHORT TERM GOAL #1   Title STG=LTG           PT Long Term Goals - 01/31/16 1350    PT LONG TERM GOAL #1   Title Postional testing for BPPV will be negative to indicate resolved of vertigo. (Target Date: 02/28/16)   Status New   PT LONG TERM GOAL #2   Title Pt will increase DHI score from 14 to 0 to indicate decreased percieved dizziness (Target Date: 02/28/16)   Status New   PT LONG TERM GOAL #3   Title Pt will independently demo vestibular HEP to maximize functional gains made in PT. (Target Date: 02/28/16)   Status New                Plan - 02/10/16 1026    Clinical Impression Statement Assessment of posterior and horizontal canals revealed no nystagmus or pt reported symptoms. The patient still reports feeling lightheaded when getting out of bed in the morning, unable to rule out vertigo or orthostatics. Therefore pt performed habituation with nose to knee with unchanged symptoms. Performed orthostatic testing from supine (120/55) to stand (94/61), then again after 3 minutes of standing (115/70). Unable to rule out orthostatic hypotension as reason for motion sensitivites; therefore, removed habituation from HEP. Reviewed patients current HEP and progressed to gait with head turns    Rehab Potential Good   PT Frequency Other (comment)  2x/week for 2 weeks followed by 1x/week for 2 weeks   PT Treatment/Interventions Vestibular;Therapeutic exercise;Therapeutic activities;Patient/family education;Neuromuscular re-education;Balance training;Canalith Repostioning;ADLs/Self Care Home Management;Gait training;Stair training;Functional mobility training;Manual techniques   PT Next Visit Plan Review and progress prn current HEP, check with patient about blood pressures taken at home    Consulted and Agree with Plan of Care Patient      Patient will benefit from skilled therapeutic intervention in order to improve the following deficits and impairments:  Dizziness  Visit Diagnosis: Dizziness and giddiness     Problem List Patient Active Problem List   Diagnosis Date Noted  . Leukopenia 01/20/2016  . Iron deficiency anemia 06/03/2012  . Anemia   . Osteopenia     Rollene FareMichaela Ellijah Leffel, SPT 02/10/2016, 1:03 PM  [facility] [address] [place], [place], [address] Phone: [phone]   Fax:  [phone]  Name: Joanna Watkins MRN: [id number] Date of Birth: Sep 27, 1945

## 2016-02-10 NOTE — Patient Instructions (Signed)
Tip Card 1.The goal of habituation training is to assist in decreasing symptoms of vertigo, dizziness, or nausea provoked by specific head and body motions. 2.These exercises may initially increase symptoms; however, be persistent and work through symptoms. With repetition and time, the exercises will assist in reducing or eliminating symptoms. 3.Exercises should be stopped and discussed with the therapist if you experience any of the following: - Sudden change or fluctuation in hearing - New onset of ringing in the ears, or increase in current intensity - Any fluid discharge from the ear - Severe pain in neck or back - Extreme nausea  Copyright  VHI. All rights reserved.  Gaze Stabilization: Tip Card 1.Target must remain in focus, not blurry, and appear stationary while head is in motion. 2.Perform exercises with small head movements (45 to either side of midline). 3.Increase speed of head motion so long as target is in focus. 4.If you wear eyeglasses, be sure you can see target through lens (therapist will give specific instructions for bifocal / progressive lenses). 5.These exercises may provoke dizziness or nausea. Work through these symptoms. If too dizzy, slow head movement slightly. Rest between each exercise. 6.Exercises demand concentration; avoid distractions. 7.For safety, perform standing exercises close to a counter, wall, corner, or next to someone.  Copyright  VHI. All rights reserved.   Gaze Stabilization: Standing Feet Together    Feet together, keeping eyes on target on wall __3__ feet away, tilt head down 15-30 and move head side to side for __30_ seconds. Repeat while moving head up and down for __30__ seconds. Do __2__ sessions per day. Repeat using target on pattern background.  Copyright  VHI. All rights reserved.    Balance: Eyes Closed - Bilateral (Varied Surfaces)    Stand with your back to a corner with a stable chair in front of you. Stand on a  pillow with feet shoulder width apart, close eyes. Maintain balance __30__ seconds. Repeat __4__ times per set. Do __2__ sets per day.  Copyright  VHI. All rights reserved.   Walking Head Turn    Standing close to a wall, walk ____ feet while turning head side to side. Touch wall if necessary to keep balance. Repeat __2_ times. Do __2__ sessions per day.  http://gt2.exer.us/536   Copyright  VHI. All rights reserved.

## 2016-02-18 ENCOUNTER — Ambulatory Visit: Payer: Medicare Other | Attending: Family Medicine | Admitting: Physical Therapy

## 2016-02-18 DIAGNOSIS — R42 Dizziness and giddiness: Secondary | ICD-10-CM | POA: Insufficient documentation

## 2016-02-18 DIAGNOSIS — H8112 Benign paroxysmal vertigo, left ear: Secondary | ICD-10-CM | POA: Diagnosis present

## 2016-02-18 DIAGNOSIS — R2689 Other abnormalities of gait and mobility: Secondary | ICD-10-CM | POA: Insufficient documentation

## 2016-02-18 DIAGNOSIS — H8111 Benign paroxysmal vertigo, right ear: Secondary | ICD-10-CM | POA: Diagnosis present

## 2016-02-18 NOTE — Patient Instructions (Signed)
Feet Apart (Compliant Surface) Head Motion - Eyes Open    Stand with your back to a corner with a stable chair in front of you.With eyes open, standing n 1 pillow, feet shoulder width apart, move head slowly: up and down 10 times; right to left 10 times. Repeat __2__ times per day.  Copyright  VHI. All rights reserved.    Walking Head Turn    Standing close to a wall, walk the length of your hallway while turning head side to side. Touch wall if necessary to keep balance. Repeat while moving head up and down (as though nodding head "yes"). Practice each exercise for 2-3 minutes per day.

## 2016-02-18 NOTE — Therapy (Signed)
[facility] [address] [place], [place], [address] Phone: [phone]   Fax:  [phone]  Physical Therapy Treatment  Patient Details  Name: Joanna Watkins MRN: [id number] Date of Birth: 1946-07-16 Referring Provider: Laurann Montanaynthia White MD  Encounter Date: 02/18/2016      PT End of Session - 02/18/16 1250    Visit Number 5   Number of Visits 7   Date for PT Re-Evaluation 03/31/16   Authorization Type UHC Medicare- G codes   PT Start Time 1016   PT Stop Time 1104   PT Time Calculation (min) 48 min   Activity Tolerance Patient tolerated treatment well   Behavior During Therapy [facility] for tasks assessed/performed      Past Medical History  Diagnosis Date  . Osteopenia   . Allergy history unknown   . Anemia   . Iron deficiency anemia 06/03/2012    Past Surgical History  Procedure Laterality Date  . Appendectomy  12/2009  . C4 fracture      There were no vitals filed for this visit.      Subjective Assessment - 02/18/16 1018    Subjective "I'm still feel woozy when I lean over and when I look to the right."   Pertinent History Osteopenia, anemia, bilateral Cataract sx October 2016   Limitations Standing;House hold activities;Walking   Patient Stated Goals Leaning back and not passing out    Currently in Pain? No/denies                Vestibular Assessment - 02/18/16 0001    Positional Testing   Dix-Hallpike Dix-Hallpike Right;Dix-Hallpike Left   Horizontal Canal Testing Horizontal Canal Right;Horizontal Canal Left   Dix-Hallpike Right   Dix-Hallpike Right Duration Lightheadedness but no true vertigo; no nystagmus   Dix-Hallpike Right Symptoms No nystagmus   Dix-Hallpike Left   Dix-Hallpike Left Duration NA   Dix-Hallpike Left Symptoms No nystagmus   Sidelying Right   Sidelying Right Duration NA   Sidelying Left   Sidelying Left Duration NA   Sidelying Left Symptoms No nystagmus    Pension scheme managerBalancemaster   Balancemaster Sensory organization test   Balancemaster Comment Composite score =72% compared to age/height normative value of 68%. Findings suggest pt ability to use somatosensory, visual, and vestibular input are all WNL.    Orthostatics   BP supine (x 5 minutes) 136/56 mmHg   HR supine (x 5 minutes) 51   BP standing (after 1 minute) 101/62 mmHg  "just a little" lightheaded, per pt   HR standing (after 1 minute) 64   BP standing (after 3 minutes) 119/70 mmHg   HR standing (after 3 minutes) 72                 [facility] Adult PT Treatment/Exercise - 02/18/16 0001    Self-Care   Self-Care Other Self-Care Comments   Other Self-Care Comments  Discussed strategies for preventing, managing orthostatic hypotension with focus on hydration, postural adjustment, compression garments. Pt verbalized understanding.             Balance Exercises - 02/18/16 1248    Balance Exercises: Standing   Gait with Head Turns Forward;2 reps;Other (comment)  symptoms with vertical and horiz turns; 2 x10' each           PT Education - 02/18/16 1048    Education provided Yes   Education Details SOT findings and functional implications. Educated pt on postural hypotension. Modified HEP; see Pt Instructions for details.  Person(s) Educated Patient   Methods Explanation;Handout;Demonstration   Comprehension Verbalized understanding;Returned demonstration          PT Short Term Goals - 01/31/16 1345    PT SHORT TERM GOAL #1   Title STG=LTG           PT Long Term Goals - 01/31/16 1350    PT LONG TERM GOAL #1   Title Postional testing for BPPV will be negative to indicate resolved of vertigo. (Target Date: 02/28/16)   Status New   PT LONG TERM GOAL #2   Title Pt will increase DHI score from 14 to 0 to indicate decreased percieved dizziness (Target Date: 02/28/16)   Status New   PT LONG TERM GOAL #3   Title Pt will independently demo vestibular HEP to maximize  functional gains made in PT. (Target Date: 02/28/16)   Status New               Plan - 02/18/16 1255    Clinical Impression Statement Pt continues to report lightheadedness with looking to R and when bending over. Further assessment reveals R Dix-Hallpike and sitting up from R Hallpike both provoke concordant lightheadedness. SOT completed with findings indicating pt use of visual, vestibular, and somatosensory input for balance WNL for age/height. Pt did report some concordant disequilibrium during Conditions 5 and 6 (vestibular reliance). Reassessment of orthostatic vital signs reveal symptomatic decrease in BP from 136/56 iin supine to 101/62 in standing. Educated pt on prevention, management of postural hypotension. Offered to notify referring MD, but pt politely declined, reporting she will attempt to stay more hydrated.    Rehab Potential Good   PT Frequency Other (comment)  2x/week for 2 weeks followed by 1x/week for 2 weeks   PT Treatment/Interventions Vestibular;Therapeutic exercise;Therapeutic activities;Patient/family education;Neuromuscular re-education;Balance training;Canalith Repostioning;ADLs/Self Care Home Management;Gait training;Stair training;Functional mobility training;Manual techniques   PT Next Visit Plan Reassess orthostatic vital signs. Review current HEP and progress prn.   Consulted and Agree with Plan of Care Patient      Patient will benefit from skilled therapeutic intervention in order to improve the following deficits and impairments:  Dizziness  Visit Diagnosis: Dizziness and giddiness     Problem List Patient Active Problem List   Diagnosis Date Noted  . Leukopenia 01/20/2016  . Iron deficiency anemia 06/03/2012  . Anemia   . Osteopenia    Jorje GuildBlair Hobble, PT, DPT [facility] [address] [place], [place], [address] Phone: [phone]   Fax:  [phone] 02/18/2016, 1:07 PM  Name: Joanna Watkins MRN: [id number] Date of Birth: Jun 24, 1946

## 2016-02-21 ENCOUNTER — Ambulatory Visit: Payer: Medicare Other | Admitting: Physical Therapy

## 2016-02-23 ENCOUNTER — Encounter: Payer: Medicare Other | Admitting: Physical Therapy

## 2016-02-24 ENCOUNTER — Ambulatory Visit: Payer: Medicare Other

## 2016-02-24 DIAGNOSIS — R42 Dizziness and giddiness: Secondary | ICD-10-CM | POA: Diagnosis not present

## 2016-02-24 NOTE — Patient Instructions (Signed)
Perform in a corner with a chair in front of you for safety:  Balance: Eyes Closed - Bilateral (Varied Surfaces)    Stand with your back to a corner with a stable chair in front of you. Stand on a pillow with feet shoulder width apart, close eyes. Maintain balance __30__ seconds. Repeat __4__ times per set. Do __2__ sets per day.  Copyright  VHI. All rights reserved.   Feet Apart (Compliant Surface) Head Motion - Eyes Open    Stand with your back to a corner with a stable chair in front of you.With eyes open, standing n 1 pillow, feet shoulder width apart, move head slowly: up and down 10 times; right to left 10 times. Repeat __2__ times per day.  Copyright  VHI. All rights reserved.    Walking Head Turn    Standing close to a wall, walk the length of your hallway while turning head to the right side for 2 full steps to left side for 2 full steps. Touch wall if necessary to keep balance. Repeat while moving head up for 2 full steps and down for 2 full steps (as though nodding head "yes"). Practice each exercise for 2-3 minutes per day.    Feet Together (Compliant Surface) Varied Arm Positions - Eyes Closed    Stand on compliant surface: ___;illow/cushion_____ with feet together and arms at your side. Close eyes and visualize upright position. Hold__10-30__ seconds. Repeat __4__ times per session. Do __2__ sessions per day.  Copyright  VHI. All rights reserved.  Feet Together (Compliant Surface) Head Motion - Eyes Open    With eyes open, standing on compliant surface: __pillow/cushion______, feet together, move head slowly: up and down 10 times and side to side 10 times.. Repeat __4__ times per session. Do __2__ sessions per day.  Copyright  VHI. All rights reserved.

## 2016-02-24 NOTE — Therapy (Signed)
[facility] [address] [place], [place], [address] Phone: [phone]   Fax:  [phone]  Physical Therapy Treatment  Patient Details  Name: Joanna Watkins MRN: [id number] Date of Birth: Jun 26, 1946 Referring Provider: Laurann Montana MD  Encounter Date: 02/24/2016      PT End of Session - 02/24/16 0844    Visit Number 6   Number of Visits 7   Date for PT Re-Evaluation 03/31/16   Authorization Type UHC Medicare- G codes   PT Start Time 0803   PT Stop Time 0841   PT Time Calculation (min) 38 min   Equipment Utilized During Treatment --  S-min A   Activity Tolerance Patient tolerated treatment well   Behavior During Therapy [facility] for tasks assessed/performed      Past Medical History  Diagnosis Date  . Osteopenia   . Allergy history unknown   . Anemia   . Iron deficiency anemia 06/03/2012    Past Surgical History  Procedure Laterality Date  . Appendectomy  12/2009  . C4 fracture      There were no vitals filed for this visit.      Subjective Assessment - 02/24/16 0807    Subjective Pt reported she is feeling a little better since last visit but still gets a little woozy.   Pertinent History Osteopenia, anemia, bilateral Cataract sx October 2016   Limitations Standing;House hold activities;Walking   Patient Stated Goals Leaning back and not passing out    Currently in Pain? No/denies                Vestibular Assessment - 02/24/16 0815    Orthostatics   BP supine (x 5 minutes) 92/47 mmHg   HR supine (x 5 minutes) 57   BP sitting 97/54 mmHg   HR sitting 64  2-3/10 wooziness upon sitting up   BP standing (after 1 minute) 87/59 mmHg   HR standing (after 1 minute) 80  no wooziness upon standing.                 [facility] Adult PT Treatment/Exercise - 02/24/16 0843    High Level Balance   High Level Balance Activities Head turns  and head nods   High Level Balance Comments On  compliant surface: 4x15'/activity pt performed head turns/nods with S to min A to maintain balance. Cues for technique.       Neuro re-ed: Pt performed balance HEP and tolerated progression. Cues for technique for new HEP. Performed with S for safety. Please see pt instructions for details. Performed in corner with chair. No dizziness/wooziness reported.         PT Education - 02/24/16 0843    Education provided Yes   Education Details PT progressed HEP as tolerated. PT discussed potential d/c next visit, will discuss with primary PT. PT discussed decr. BP during orthostatic testing not significant today but to continue to stay hydrated and wait to amb. if pt feels dizzy.   Person(s) Educated Patient   Methods Explanation;Demonstration;Verbal cues;Handout   Comprehension Returned demonstration;Verbalized understanding          PT Short Term Goals - 01/31/16 1345    PT SHORT TERM GOAL #1   Title STG=LTG           PT Long Term Goals - 01/31/16 1350    PT LONG TERM GOAL #1   Title Postional testing for BPPV will be negative to indicate resolved of vertigo. (Target Date: 02/28/16)  Status New   PT LONG TERM GOAL #2   Title Pt will increase DHI score from 14 to 0 to indicate decreased percieved dizziness (Target Date: 02/28/16)   Status New   PT LONG TERM GOAL #3   Title Pt will independently demo vestibular HEP to maximize functional gains made in PT. (Target Date: 02/28/16)   Status New               Plan - 02/24/16 0844    Clinical Impression Statement Pt demonstrated progress, as she was able to tolerate progressed balance HEP, in order to improve vestibular input. Pt continues to experience incr. postural sway and LOB during activities which require vestibular input. Continue with POC.    Rehab Potential Good   PT Frequency Other (comment)  2x/week for 2 weeks followed by 1x/week for 2 weeks   PT Treatment/Interventions Vestibular;Therapeutic  exercise;Therapeutic activities;Patient/family education;Neuromuscular re-education;Balance training;Canalith Repostioning;ADLs/Self Care Home Management;Gait training;Stair training;Functional mobility training;Manual techniques   PT Next Visit Plan Assess goal and d/c if appropriate.   PT Home Exercise Plan Balance HEP.   Consulted and Agree with Plan of Care Patient      Patient will benefit from skilled therapeutic intervention in order to improve the following deficits and impairments:  Dizziness  Visit Diagnosis: Dizziness and giddiness     Problem List Patient Active Problem List   Diagnosis Date Noted  . Leukopenia 01/20/2016  . Iron deficiency anemia 06/03/2012  . Anemia   . Osteopenia     Joas Motton L 02/24/2016, 8:46 AM  [facility] [address] [place], [place], [address] Phone: [phone]   Fax:  [phone]  Name: Joanna Watkins MRN: [id number] Date of Birth: Feb 26, 1946    Zerita BoersJennifer Jamori Biggar, PT,DPT 02/24/2016 8:46 AM Phone: [phone] Fax: [phone]

## 2016-02-28 ENCOUNTER — Ambulatory Visit: Payer: Medicare Other | Admitting: Physical Therapy

## 2016-02-28 DIAGNOSIS — R42 Dizziness and giddiness: Secondary | ICD-10-CM

## 2016-02-28 DIAGNOSIS — H8111 Benign paroxysmal vertigo, right ear: Secondary | ICD-10-CM

## 2016-02-28 DIAGNOSIS — R2689 Other abnormalities of gait and mobility: Secondary | ICD-10-CM

## 2016-02-28 NOTE — Therapy (Signed)
[facility] [address] [place], [place], [address] Phone: [phone]   Fax:  [phone]  Physical Therapy Treatment  Patient Details  Name: Joanna Watkins MRN: [id number] Date of Birth: 23-Sep-1945 Referring Provider: Harlan Stains, MD  Encounter Date: 02/28/2016      PT End of Session - 02/28/16 0956    Visit Number 7   Number of Visits 11  Requesting 4 additional sessions   Date for PT Re-Evaluation 03/29/16   Authorization Type UHC Medicare- G codes   PT Start Time 0853   PT Stop Time 0931   PT Time Calculation (min) 38 min   Activity Tolerance Patient tolerated treatment well   Behavior During Therapy [facility] for tasks assessed/performed      Past Medical History  Diagnosis Date  . Osteopenia   . Allergy history unknown   . Anemia   . Iron deficiency anemia 06/03/2012    Past Surgical History  Procedure Laterality Date  . Appendectomy  12/2009  . C4 fracture      There were no vitals filed for this visit.      Subjective Assessment - 02/28/16 0855    Subjective "The only time I really have it is when I lean over to the right."   Pertinent History Osteopenia, anemia, bilateral Cataract sx October 2016   Limitations Standing;House hold activities;Walking   Patient Stated Goals Leaning back and not passing out    Currently in Pain? No/denies            [facility] PT Assessment - 02/28/16 0001    Assessment   Medical Diagnosis Vertigo   Referring Provider Harlan Stains, MD   Onset Date/Surgical Date 05/15/15            Vestibular Assessment - 02/28/16 0001    Positional Testing   Dix-Hallpike Dix-Hallpike Right   Horizontal Canal Testing Horizontal Canal Right;Horizontal Canal Left   Dix-Hallpike Right   Dix-Hallpike Right Duration Using Frenzel lenses, noted low amplitude R upbeating torsional nystagmus approx. 50 seconds duration accompanied by concordant lightheadedness. No latency.    Dix-Hallpike Right Symptoms No nystagmus   Sidelying Right   Sidelying Right Duration Using Frenzel lenses, noted low amplitude nystagmus > 45 seconds accompanied by lightheadedness.   Sidelying Right Symptoms Upbeat, right rotatory nystagmus   Horizontal Canal Right   Horizontal Canal Right Duration Right beating nystagmus with torsional component; therefore, reassessed Dix-Hallpike with Frenzel lenses on. See above for details.                 [facility] Adult PT Treatment/Exercise - 02/28/16 0001    Ambulation/Gait   Ambulation/Gait Yes   Ambulation/Gait Assistance 4: Min guard;6: Modified independent (Device/Increase time)   Ambulation/Gait Assistance Details Mod I for linear gait without head turns; min guard for gait with horizontal head turns.   Ambulation Distance (Feet) 150 Feet   Assistive device None         Vestibular Treatment/Exercise - 02/28/16 0001    Vestibular Treatment/Exercise   Vestibular Treatment Provided Canalith Repositioning;Habituation   Canalith Repositioning Epley Manuever Right;Semont Procedure Right Posterior    EPLEY MANUEVER RIGHT   Number of Reps  1   Overall Response Improved Symptoms   Response Details  Reassessment of R Sidelying Test reveals R upbeating torsional nystagmus approx 10-15 seconds with 8-10 sec latency.   Semont Procedure Right Posterior   Number of Reps  2   Overall Response  Improved Symptoms  upon  reassessment of R posterior canal   Response Details  After R PC Semont/Liberatory maneuver x1, reassessment of R Dix-Hallpike reveals 15 seconds of R upbeating torsional nystagmus with 8-10 second latency. Therefore, performed R Epley maneuver x1.  See above for details.   Nestor Lewandowsky   Number of Reps  2   Symptom Description  Reviewed x2 trials per side; added to HEP again (as postural hypotension appears to have resolved).    Horizontal Roll   Number of Reps  2   Symptom Description  Reviewed x2 trials per direction with  symptoms initiallt (1/5 lightheadedness) on R side, which improved on second trial.            Balance Exercises - 02/28/16 0954    Balance Exercises: Standing   Gait with Head Turns Forward;2 reps  2 x30' with horizontal head turns; gait instability noted           PT Education - 02/28/16 0947    Education provided Yes   Education Details Goals, findings, progress, and POC. Discussed extending POC 1x/week for 4 additional weeks. Modified HEP to habituation only. See Pt Instructions for details.    Person(s) Educated Patient   Methods Explanation;Demonstration;Verbal cues;Handout   Comprehension Verbalized understanding;Returned demonstration          PT Short Term Goals - 01/31/16 1345    PT SHORT TERM GOAL #1   Title STG=LTG           PT Long Term Goals - 02/28/16 1008    PT LONG TERM GOAL #1   Title Postional testing for BPPV will be negative and asymptomatic to indicate resolved  BPPV and motion sensivity. (Target Date: 03/27/16)   Status On-going   PT LONG TERM GOAL #2   Title Pt will increase DHI score from 14 to 0 to indicate decreased percieved dizziness. (Target Date: 03/27/16)   Status On-going   PT LONG TERM GOAL #3   Title Pt will independently demo vestibular HEP to maximize functional gains made in PT. (Target Date: 02/28/16)   Baseline Met 7/17.   Status Achieved               Plan - 02/28/16 0957    Clinical Impression Statement Pt continues to report lightheadedness when lying on R side in bed. Postural hypotension appears to have resolved, per BP readings at last session. Therefore, performed reassessment for BPPV with Frenzel lenses. R Dix-Hallpike reveals low amplitude nystagmus >45 seconds, no latency, accompanied by concordant lightheadedness. Unable to rule out R posterior canal cupuolithiasis. Therefore, performed R PC Semont Maneuver x2 trials. Reassessment of R Hallpike reveals R upbeating nystagmus x15 seconds with 8-10 sec  latency, consistent with R posterior canalithaisis. Performed R Epley x1, and modified HEP to habituation only Laruth Bouchard Daroff and horizontal rolling, as both provoke symptoms). Pt will benefit from skilled outpatient PT 1x/week for 4 additional weeks to address said impairments.    Rehab Potential Good   PT Frequency 1x / week   PT Duration 4 weeks   PT Treatment/Interventions Vestibular;Therapeutic exercise;Therapeutic activities;Patient/family education;Neuromuscular re-education;Balance training;Canalith Repostioning;ADLs/Self Care Home Management;Gait training;Stair training;Functional mobility training;Manual techniques   PT Next Visit Plan Reassess for BPPV (R PC) using Frenzels and treat prn.   Consulted and Agree with Plan of Care Patient      Patient will benefit from skilled therapeutic intervention in order to improve the following deficits and impairments:  Abnormal gait, Dizziness  Visit Diagnosis: BPPV (benign paroxysmal positional vertigo),  right - Plan: PT plan of care cert/re-cert  Dizziness and giddiness - Plan: PT plan of care cert/re-cert  Other abnormalities of gait and mobility - Plan: PT plan of care cert/re-cert     Problem List Patient Active Problem List   Diagnosis Date Noted  . Leukopenia 01/20/2016  . Iron deficiency anemia 06/03/2012  . Anemia   . Osteopenia     [name], PT, DPT [facility] [address] [place], [place], [address] Phone: [phone]   Fax:  [phone] 02/28/2016, 10:09 AM  Name: ADRIEANNA BOTELER MRN: [id number] Date of Birth: Jan 12, 1946

## 2016-02-28 NOTE — Patient Instructions (Signed)
Tip Card 1.The goal of habituation training is to assist in decreasing symptoms of vertigo, dizziness, or nausea provoked by specific head and body motions. 2.These exercises may initially increase symptoms; however, be persistent and work through symptoms. With repetition and time, the exercises will assist in reducing or eliminating symptoms. 3.Exercises should be stopped and discussed with the therapist if you experience any of the following: - Sudden change or fluctuation in hearing - New onset of ringing in the ears, or increase in current intensity - Any fluid discharge from the ear - Severe pain in neck or back - Extreme nausea  Copyright  VHI. All rights reserved.  Rolling   With pillow under head, start on back. Roll to your right side.  Hold until dizziness stops, plus 20 seconds and then roll to the left side.  Hold until dizziness stops, plus 20 seconds.  Repeat sequence 5 times per session. Do 2 sessions per day.  Copyright  VHI. All rights reserved.  Sit to Side-Lying   Sit on edge of bed. Lie down onto the right side and hold until dizziness stops, plus 20 seconds.  Return to sitting and wait until dizziness stops, plus 20 seconds.  Repeat to the left side. Repeat sequence 5 times per session. Do 2 sessions per day.  Copyright  VHI. All rights reserved.   

## 2016-03-06 ENCOUNTER — Ambulatory Visit: Payer: Medicare Other | Admitting: Physical Therapy

## 2016-03-06 DIAGNOSIS — R42 Dizziness and giddiness: Secondary | ICD-10-CM

## 2016-03-06 DIAGNOSIS — H8112 Benign paroxysmal vertigo, left ear: Secondary | ICD-10-CM

## 2016-03-06 NOTE — Patient Instructions (Signed)
Joanna Watkins,  You may stop performing the exercise in which you transition from sitting to/from side lying. Perform the exercise below twice per day and try sleeping on your left side.  Tip Card 1.The goal of habituation training is to assist in decreasing symptoms of vertigo, dizziness, or nausea provoked by specific head and body motions. 2.These exercises may initially increase symptoms; however, be persistent and work through symptoms. With repetition and time, the exercises will assist in reducing or eliminating symptoms. 3.Exercises should be stopped and discussed with the therapist if you experience any of the following: - Sudden change or fluctuation in hearing - New onset of ringing in the ears, or increase in current intensity - Any fluid discharge from the ear - Severe pain in neck or back - Extreme nausea  Copyright  VHI. All rights reserved.  Rolling   With pillow under head, start on back. Roll  quickly to your right side.  Hold until dizziness stops, plus 20 seconds and then  quickly roll to the left side without stopping on your back.  Hold until dizziness stops, plus 20 seconds.  Repeat sequence 5 times per session. Do 2 sessions per day.

## 2016-03-06 NOTE — Therapy (Signed)
[facility] [address] [place], [place], [address] Phone: [phone]   Fax:  [phone]  Physical Therapy Treatment  Patient Details  Name: DYANI BABEL MRN: [id number] Date of Birth: March 22, 1946 Referring Provider: Harlan Stains, MD  Encounter Date: 03/06/2016      PT End of Session - 03/06/16 0906    Visit Number 8   Number of Visits 11   Date for PT Re-Evaluation 03/29/16   Authorization Type UHC Medicare- G codes   PT Start Time 0846   PT Stop Time 0924   PT Time Calculation (min) 38 min   Activity Tolerance Patient tolerated treatment well   Behavior During Therapy [facility] for tasks assessed/performed      Past Medical History:  Diagnosis Date  . Allergy history unknown   . Anemia   . Iron deficiency anemia 06/03/2012  . Osteopenia     Past Surgical History:  Procedure Laterality Date  . APPENDECTOMY  12/2009  . C4 Fracture      There were no vitals filed for this visit.      Subjective Assessment - 03/06/16 0849    Subjective "I'm still feeling a little bit better...it'd definitely not worse. I still feel the wooziness when I roll over to my left side when I'm on my back. During the day, it's okay. It's the worst earlier in the day and at night when I'm getting in and out of bed."   Pertinent History Osteopenia, anemia, bilateral Cataract sx October 2016   Patient Stated Goals Leaning back and not passing out    Currently in Pain? No/denies                Vestibular Assessment - 03/06/16 0001      Horizontal Canal Right   Horizontal Canal Right Duration Ageotropic nystagmus x10 seconds (able to visualize when gaze in direction of nystagmus) accompanied by motion sensitivity   Horizontal Canal Right Symptoms Geotrophic;Other (comment)     Horizontal Canal Left   Horizontal Canal Left Duration NA   Horizontal Canal Left Symptoms Normal                  Vestibular  Treatment/Exercise - 03/06/16 0001      Vestibular Treatment/Exercise   Vestibular Treatment Provided Canalith Repositioning;Habituation   Canalith Repositioning Comment;Canal Roll Left   Habituation Exercises Horizontal Roll     Canal Roll Left   Number of Reps  1   Overall Response  Improved Symptoms   Response Details  Reassessment reveals no nystagmus, no symptoms.     OTHER   Comment Performed L Cupulolith Repositioning Maneuver as described by Maudie Mercury, et al (2012) with each position held x2 minutes and vibrator at L mastoid process for initial 30 seconds of positions 1 and 4. Reassessment reveals < 5 seconds geotropic nystagmus (when gaze in direction of ground) with assessment of L HC, < 5 seconds ageotropic nystagmus with assessment of R HC; asymptomatic in B HC testing positions but increased "wooziness" upon sitting.     Horizontal Roll   Number of Reps  2   Symptom Description  with cueing for technique, pt effectively performed fast rolling x2 reps.               PT Education - 03/06/16 0932    Education provided Yes   Education Details Modified HEP to remove [employer], change horizontal rolling to fast rolling. Also instructed pt in prolonged positioning.  Person(s) Educated Patient   Methods Explanation;Demonstration;Handout;Verbal cues   Comprehension Verbalized understanding;Returned demonstration          PT Short Term Goals - 01/31/16 1345      PT SHORT TERM GOAL #1   Title STG=LTG           PT Long Term Goals - 02/28/16 1008      PT LONG TERM GOAL #1   Title Postional testing for BPPV will be negative and asymptomatic to indicate resolved  BPPV and motion sensivity. (Target Date: 03/27/16)   Status On-going     PT LONG TERM GOAL #2   Title Pt will increase DHI score from 14 to 0 to indicate decreased percieved dizziness. (Target Date: 03/27/16)   Status On-going     PT LONG TERM GOAL #3   Title Pt will independently demo vestibular HEP  to maximize functional gains made in PT. (Target Date: 02/28/16)   Baseline Met 7/17.   Status Achieved               Plan - 03/06/16 0906    Clinical Impression Statement Pt reporting no further dizziness/lightheadedness with Nestor Lewandowsky HEP but continues to report symptoms with horizontal rolling. Rassessment of horizontal canals reveals ageotropiic nystagmus in R sideyling with symptoms on R > L. Unable to rule out L horizontal cupulolithiasis. Therefore, treated with L Cupulolith Repositioning Maneuver described by Liborio Nixon al (2012). Reassessment reveals < 5 seconds of ageotropic nystagmus on R, < 5 seconds geotropic nystagmus on L; asymptomatic bilaterally. Performed L Canal Roll, after which HC testing negative and asymptomatic bilaterally. Unable to discern if BPPV cleared or fatigued during this session. Therefore, instructed pt in fast rolling for habituation and prolonged positioning.    Rehab Potential Good   PT Frequency 1x / week   PT Duration 4 weeks   PT Treatment/Interventions Vestibular;Therapeutic exercise;Therapeutic activities;Patient/family education;Neuromuscular re-education;Balance training;Canalith Repostioning;ADLs/Self Care Home Management;Gait training;Stair training;Functional mobility training;Manual techniques   PT Next Visit Plan Reassess B horizontal canals and treat prn.   Consulted and Agree with Plan of Care Patient      Patient will benefit from skilled therapeutic intervention in order to improve the following deficits and impairments:  Abnormal gait, Dizziness  Visit Diagnosis: BPPV (benign paroxysmal positional vertigo), left  Dizziness and giddiness     Problem List Patient Active Problem List   Diagnosis Date Noted  . Leukopenia 01/20/2016  . Iron deficiency anemia 06/03/2012  . Anemia   . Osteopenia     [name], PT, DPT [facility] [address] [place], [place], [address] Phone:  [phone]   Fax:  [phone] 03/06/16, 9:41 AM  Name: ANETHA SLAGEL MRN: [id number] Date of Birth: 07-25-1946

## 2016-03-14 ENCOUNTER — Ambulatory Visit: Payer: Medicare Other | Attending: Family Medicine | Admitting: Physical Therapy

## 2016-03-14 DIAGNOSIS — R2689 Other abnormalities of gait and mobility: Secondary | ICD-10-CM | POA: Diagnosis not present

## 2016-03-14 DIAGNOSIS — R42 Dizziness and giddiness: Secondary | ICD-10-CM | POA: Diagnosis present

## 2016-03-14 NOTE — Patient Instructions (Signed)
Gaze Stabilization: Standing Feet Together    Feet together, keeping eyes on target on wall __3__ feet away, tilt head down 15-30 and move head side to side for __30__ seconds. Repeat while moving head up and down for _30___ seconds. Do __2_ sessions per day. Repeat using target on pattern background.  Gaze Stabilization: Tip Card  1.Target must remain in focus, not blurry, and appear stationary while head is in motion. 2.Perform exercises with small head movements (45 to either side of midline). 3.Increase speed of head motion so long as target is in focus..  Copyright  VHI. All rights reserved.    Turning    Place a visual target ("A") on the wall at eye-level. Turn 360 degrees to the RIGHT side, leading with head and eyes. Hold position until symptoms subside. Repeat for 5 consecutive repetitions, stopping between each rep to allow symptoms to subside.  Do _2-3___ sessions per day.  Copyright  VHI. All rights reserved.   Walking Head Turn    Standing close to a wall, walk the length of your hallway while turning head right to left (2 steps with head to right, 2 steps with head to left...). Touch wall if necessary to keep balance. Practice this exercise for 3-4 minutes per day. Practice walking in the same manner, except with vertical head turns (up for 2 steps, down for 2 steps) for 2 minutes per day.

## 2016-03-14 NOTE — Therapy (Signed)
[facility] [address] [place], [place], [address] Phone: [phone]   Fax:  [phone]  Physical Therapy Treatment  Patient Details  Name: Joanna Watkins MRN: [id number] Date of Birth: 09/22/1945 Referring Provider: Harlan Stains, MD  Encounter Date: 03/14/2016      PT End of Session - 03/14/16 1724    Visit Number 9   Number of Visits 11   Date for PT Re-Evaluation 03/29/16   Authorization Type UHC Medicare- G codes   PT Start Time [phone]   PT Stop Time 1011  Session ended early due to no further treatment indicated at this time   PT Time Calculation (min) 35 min   Activity Tolerance Patient tolerated treatment well   Behavior During Therapy [facility] for tasks assessed/performed      Past Medical History:  Diagnosis Date  . Allergy history unknown   . Anemia   . Iron deficiency anemia 06/03/2012  . Osteopenia     Past Surgical History:  Procedure Laterality Date  . APPENDECTOMY  12/2009  . C4 Fracture      There were no vitals filed for this visit.      Subjective Assessment - 03/14/16 0940    Subjective "I'm feeling much better. I only feel a little bit dizzy when rolling to the left side...and I get a little off-balance when I turn my head quickly to both sides."   Pertinent History Osteopenia, anemia, bilateral Cataract sx October 2016   Limitations Standing;House hold activities;Walking   Patient Stated Goals Leaning back and not passing out    Currently in Pain? No/denies                Vestibular Assessment - 03/14/16 0001      Positional Testing   Horizontal Canal Testing Horizontal Canal Right;Horizontal Canal Left     Horizontal Canal Right   Horizontal Canal Right Duration NA   Horizontal Canal Right Symptoms Normal     Horizontal Canal Left   Horizontal Canal Left Duration NA   Horizontal Canal Left Symptoms Normal                 [facility] Adult PT  Treatment/Exercise - 03/14/16 0001      Ambulation/Gait   Ambulation/Gait Yes   Ambulation/Gait Assistance 6: Modified independent (Device/Increase time);5: Supervision;4: Min guard   Ambulation/Gait Assistance Details (S) for gait with head turns over level surfaces; min guard for gait with head turns over unlevel grass   Ambulation Distance (Feet) 800 Feet  x500' outdoors, x300' indoors   Assistive device None   Gait Pattern Step-through pattern;Narrow base of support  narrow BOS, LOB with horizontal > vertical head turns   Ambulation Surface Level;Unlevel;Indoor;Outdoor;Paved;Grass   Ramp 6: Modified independent (Device)   Curb 6: Modified independent (Device/increase time)         Vestibular Treatment/Exercise - 03/14/16 0001      Vestibular Treatment/Exercise   Vestibular Treatment Provided Gaze   Gaze Exercises X1 Viewing Horizontal;X1 Viewing Vertical     X1 Viewing Horizontal   Foot Position standing; narrow BOS   Reps 2   Comments 2x30-sec trials     X1 Viewing Vertical   Foot Position standing; narrow BOS   Reps 2   Comments 2 x30-sec trials            Balance Exercises - 03/14/16 1721      Balance Exercises: Standing   Standing, One Foot on a Step Eyes  open;Head turns;6 inch;Other reps (comment)  x10 reps with each LE on step (as though descending)   Gait with Head Turns Forward;Retro;4 reps;Intermittent upper extremity support  x50' with horizontal, x50' with vertical - x2 forward/retro   Retro Gait Head turns;Other (comment)  tossing ball, following with eyes/head 2 x50'   Turning Right;10 reps  2 x5 reps    Other Standing Exercises Semi tandem on declined standard ramp wit horizontal, vertical head turns x10 each on solid surface x1 trial, on compliant surface x1 trial; minimal sway noted.           PT Education - 03/14/16 1718    Education provided Yes   Education Details Vestibular HEP progressed; see Pt Instructions for details.     Person(s) Educated Patient   Methods Explanation;Demonstration;Handout;Verbal cues   Comprehension Verbalized understanding;Returned demonstration          PT Short Term Goals - 01/31/16 1345      PT SHORT TERM GOAL #1   Title STG=LTG           PT Long Term Goals - 03/14/16 1728      PT LONG TERM GOAL #1   Title Postional testing for BPPV will be negative and asymptomatic to indicate resolved  BPPV and motion sensivity. (Target Date: 03/27/16)   Baseline Met 8/1.   Status Achieved     PT LONG TERM GOAL #2   Title Pt will increase DHI score from 14 to 0 to indicate decreased percieved dizziness. (Target Date: 03/27/16)   Status On-going     PT LONG TERM GOAL #3   Title Pt will independently demo vestibular HEP to maximize functional gains made in PT. (Target Date: 02/28/16)   Baseline Met 7/17.   Status Achieved               Plan - 03/14/16 1725    Clinical Impression Statement Horizontal canal testing (-) and asymptomatic, suggesting BPPV now cleared. Initially during this session, pt exhibits decreased gait stability with functional head turns (horizontal > vertical) and reports disequilibrium, imbalance with 360-degree turns to R side. Noted within-session improvement with both turns and functional head turns. Progresed HEP to include aforementioned impairments, in addition to x1 viewing with narrow BOS. Tentatively plan to DC at next session. Pt in full agreement.    Rehab Potential Good   PT Frequency 1x / week   PT Duration 4 weeks   PT Treatment/Interventions Vestibular;Therapeutic exercise;Therapeutic activities;Patient/family education;Neuromuscular re-education;Balance training;Canalith Repostioning;ADLs/Self Care Home Management;Gait training;Stair training;Functional mobility training;Manual techniques   PT Next Visit Plan DC, if all goals met. If no DC, do GCODES and PN.    Consulted and Agree with Plan of Care Patient      Patient will benefit from  skilled therapeutic intervention in order to improve the following deficits and impairments:  Abnormal gait, Dizziness  Visit Diagnosis: Other abnormalities of gait and mobility  Dizziness and giddiness     Problem List Patient Active Problem List   Diagnosis Date Noted  . Leukopenia 01/20/2016  . Iron deficiency anemia 06/03/2012  . Anemia   . Osteopenia    [name], PT, DPT [facility] [address] [place], [place], [address] Phone: [phone]   Fax:  [phone] 03/14/16, 5:29 PM  Name: Joanna Watkins MRN: [id number] Date of Birth: 1945-12-22

## 2016-03-20 ENCOUNTER — Ambulatory Visit: Payer: Medicare Other | Admitting: Physical Therapy

## 2016-03-20 DIAGNOSIS — R2689 Other abnormalities of gait and mobility: Secondary | ICD-10-CM | POA: Diagnosis not present

## 2016-03-20 DIAGNOSIS — R42 Dizziness and giddiness: Secondary | ICD-10-CM

## 2016-03-20 NOTE — Patient Instructions (Signed)
Gaze Stabilization: Standing Feet Together    Feet together, keeping eyes on target on wall __3__ feet away, tilt head down 15-30 and move head side to side for __30__ seconds. Repeat while moving head up and down for _30___ seconds. Do __2_ sessions per day. Repeat using target on pattern background.   To progress this exercise: 1) increase speed of head movement, so long as target is in focus; 2) increase the duration by 15-second increments (from 30 to 45 seconds, then from 45 to 60 seconds, etc.) until you're able to perform this exercise for 2 consecutive minutes.  Gaze Stabilization: Tip Card  1.Target must remain in focus, not blurry, and appear stationary while head is in motion. 2.Perform exercises with small head movements (45 to either side of midline). 3.Increase speed of head motion so long as target is in focus..  Copyright  VHI. All rights reserved.    Turning    Place a visual target ("A") on the wall at eye-level. Turn 360 degrees to the RIGHT side, leading with head and eyes. Hold position until symptoms subside. Repeat for 5 consecutive repetitions, stopping between each rep to allow symptoms to subside.  Do _2__ sessions per day.  Progress this exercise by increasing reps incrementally by 1-2 reps at a time, as tolerated, until you're able to perform 10 full turns consecutively without difficulty or dizziness.   Joanna BabinskiMarilyn, if you experience room-spinning dizziness when getting into/out of bed, you may try the exercises below to clear vertigo. * If you ever experience vertigo that is constant, occurs at rest, or does not change with head/body movement (getting in and out of bed), seek immediate medical attention.    Tip Card 1.The goal of habituation training is to assist in decreasing symptoms of vertigo, dizziness, or nausea provoked by specific head and body motions. 2.These exercises may initially increase symptoms; however, be persistent and work through  symptoms. With repetition and time, the exercises will assist in reducing or eliminating symptoms. 3.Exercises should be stopped and discussed with the therapist if you experience any of the following: - Sudden change or fluctuation in hearing - New onset of ringing in the ears, or increase in current intensity - Any fluid discharge from the ear - Severe pain in neck or back - Extreme nausea  Copyright  VHI. All rights reserved.  Rolling   With pillow under head, start on back. Roll to your right side.  Hold until dizziness stops, plus 20 seconds and then roll to the left side.  Hold until dizziness stops, plus 20 seconds.  Repeat sequence 5 times per session. Do 2 sessions per day.  Copyright  VHI. All rights reserved.  Sit to Side-Lying   Sit on edge of bed. Lie down onto the right side and hold until dizziness stops, plus 20 seconds.  Return to sitting and wait until dizziness stops, plus 20 seconds.  Repeat to the left side. Repeat sequence 5 times per session. Do 2 sessions per day.  Copyright  VHI. All rights reserved.

## 2016-03-20 NOTE — Therapy (Signed)
[facility] [address] [place], [place], [address] Phone: [phone]   Fax:  [phone]  Physical Therapy Treatment and Discharge Summary  Patient Details  Name: Joanna Watkins MRN: [id number] Date of Birth: 26-Nov-1945 Referring Provider: Harlan Stains, MD  Encounter Date: 03/20/2016      PT End of Session - 03/20/16 1009    Visit Number 10   Number of Visits 11   Date for PT Re-Evaluation 03/29/16   Authorization Type UHC Medicare- G codes   PT Start Time 0933   PT Stop Time 1275  Session ended early due to goals addressed, patient discharged   PT Time Calculation (min) 29 min   Activity Tolerance Patient tolerated treatment well   Behavior During Therapy [facility] for tasks assessed/performed      Past Medical History:  Diagnosis Date  . Allergy history unknown   . Anemia   . Iron deficiency anemia 06/03/2012  . Osteopenia     Past Surgical History:  Procedure Laterality Date  . APPENDECTOMY  12/2009  . C4 Fracture      There were no vitals filed for this visit.      Subjective Assessment - 03/20/16 0935    Subjective "I think I'm out of the woods. The exercises don't really bother me now - except the turns.Marland KitchenMarland KitchenI felt a little dizzy by the fifth one."   Pertinent History Osteopenia, anemia, bilateral Cataract sx October 2016   Limitations Standing;House hold activities;Walking   Patient Stated Goals Leaning back and not passing out    Currently in Pain? No/denies                          Vestibular Treatment/Exercise - 03/20/16 0001      Vestibular Treatment/Exercise   Vestibular Treatment Provided Gaze   Habituation Exercises 360 degree Turns     360 degree Turns   Number of Reps  7   Symptom Description  imbalance onset after 4th rep   COMMENT Cueing to stop between reps, allow time for symptoms to resolve prior to resuming     X1 Viewing Horizontal   Foot Position  standing; narrow BOS   Reps 1   Comments 45 seconds; effective between-session carryover of technique     X1 Viewing Vertical   Foot Position standing; narrow BOS   Reps 1   Comments 45 seconds               PT Education - 03/20/16 1007    Education provided Yes   Education Details Goals, findings, progress, and DC plan. Appropriate progression of vestibular HPE after DC.    Person(s) Educated Patient   Methods Explanation;Demonstration;Verbal cues;Handout   Comprehension Verbalized understanding;Returned demonstration          PT Short Term Goals - 01/31/16 1345      PT SHORT TERM GOAL #1   Title STG=LTG           PT Long Term Goals - 03/20/16 1700      PT LONG TERM GOAL #1   Title Postional testing for BPPV will be negative and asymptomatic to indicate resolved  BPPV and motion sensivity. (Target Date: 03/27/16)   Baseline Met 8/1.   Status Achieved     PT LONG TERM GOAL #2   Title Pt will increase DHI score from 14 to 0 to indicate decreased percieved dizziness. (Target Date: 03/27/16)   Baseline 8/7: [place] =  7   Status Partially Met     PT LONG TERM GOAL #3   Title Pt will independently demo vestibular HEP to maximize functional gains made in PT. (Target Date: 02/28/16)   Baseline Met 7/17.   Status Achieved               Plan - April 11, 2016 1010    Clinical Impression Statement Pt has met (or partially met) all goals and reports mild dizziness only when performing multiple consecutive 360-degree turns to R side. Session focused and providing education on appropriate HEP progression after DC from PT as well as explanation of safe plan should patient experience vertigo in the future. Pt verbalized understanding and was in full agreement.    Consulted and Agree with Plan of Care Patient      Patient will benefit from skilled therapeutic intervention in order to improve the following deficits and impairments:     Visit Diagnosis: Dizziness and  giddiness       G-Codes - 04/11/2016 0958    Functional Assessment Tool Used DHI = 7   Functional Limitation Self care   Self Care Goal Status ([id number]) At least 1 percent but less than 20 percent impaired, limited or restricted   Self Care Discharge Status [phone]) At least 1 percent but less than 20 percent impaired, limited or restricted      Problem List Patient Active Problem List   Diagnosis Date Noted  . Leukopenia 01/20/2016  . Iron deficiency anemia 06/03/2012  . Anemia   . Osteopenia    PHYSICAL THERAPY DISCHARGE SUMMARY  Visits from Start of Care: 10  Current functional level related to goals / functional outcomes: See above.   Remaining deficits: See above.   Education / Equipment: See above.  Plan: Patient agrees to discharge.  Patient goals were met. Patient is being discharged due to meeting the stated rehab goals.  ?????         Joanna Watkins, PT, DPT [facility] [address] [place], [place], [address] Phone: [phone]   Fax:  [phone] 2016/04/11, 10:12 AM   Name: Joanna Watkins MRN: [id number] Date of Birth: [birth year]

## 2016-03-27 ENCOUNTER — Encounter: Payer: Medicare Other | Admitting: Physical Therapy

## 2016-04-03 ENCOUNTER — Encounter: Payer: Medicare Other | Admitting: Physical Therapy

## 2019-09-11 ENCOUNTER — Ambulatory Visit: Payer: Medicare Other

## 2019-09-19 ENCOUNTER — Ambulatory Visit: Payer: Medicare Other | Attending: Internal Medicine

## 2019-09-19 DIAGNOSIS — Z23 Encounter for immunization: Secondary | ICD-10-CM | POA: Insufficient documentation

## 2019-09-19 NOTE — Progress Notes (Signed)
   Covid-19 Vaccination Clinic  Name:  Joanna Watkins    MRN: [id number] DOB: 1946-01-04  09/19/2019  Ms. Rottman was observed post Covid-19 immunization for 15 minutes without incidence. She was provided with Vaccine Information Sheet and instruction to access the V-Safe system.   Ms. Laverdure was instructed to call 911 with any severe reactions post vaccine: Marland Kitchen Difficulty breathing  . Swelling of your face and throat  . A fast heartbeat  . A bad rash all over your body  . Dizziness and weakness    Immunizations Administered    Name Date Dose VIS Date Route   Pfizer COVID-19 Vaccine 09/19/2019  3:54 PM 0.3 mL 07/25/2019 Intramuscular   Manufacturer: [employer], [employer]   Lot: [id number]   NDC: [address]-2

## 2019-10-02 ENCOUNTER — Ambulatory Visit: Payer: Medicare Other

## 2019-10-15 ENCOUNTER — Ambulatory Visit: Payer: Medicare Other | Attending: Internal Medicine

## 2019-10-15 DIAGNOSIS — Z23 Encounter for immunization: Secondary | ICD-10-CM | POA: Insufficient documentation

## 2019-10-15 NOTE — Progress Notes (Signed)
   Covid-19 Vaccination Clinic  Name:  Joanna Watkins    MRN: [id number] DOB: [birth year]  10/15/2019  [name] was observed post Covid-19 immunization for 15 minutes without incident. She was provided with Vaccine Information Sheet and instruction to access the V-Safe system.   [name] was instructed to call 911 with any severe reactions post vaccine: Marland Kitchen Difficulty breathing  . Swelling of face and throat  . A fast heartbeat  . A bad rash all over body  . Dizziness and weakness   Immunizations Administered    Name Date Dose VIS Date Route   Pfizer COVID-19 Vaccine 10/15/2019  8:14 AM 0.3 mL 07/25/2019 Intramuscular   Manufacturer: [employer], [employer]   Lot: [id number]   NDC: [address]-2

## 2020-02-06 ENCOUNTER — Other Ambulatory Visit: Payer: Self-pay | Admitting: Family Medicine

## 2020-02-06 ENCOUNTER — Ambulatory Visit
Admission: RE | Admit: 2020-02-06 | Discharge: 2020-02-06 | Disposition: A | Discharge status: Home / Self Care | Payer: Medicare Other | Source: Ambulatory Visit | Attending: Family Medicine | Admitting: Family Medicine

## 2020-02-06 DIAGNOSIS — M79672 Pain in left foot: Secondary | ICD-10-CM

## 2020-06-25 IMAGING — DX DG FOOT 2V*L*
2 series · 2 of 2 positions shown · non-contrast
Comparison: None.

CLINICAL DATA: Left foot pain.

EXAM:
LEFT FOOT - 2 VIEW

[dg foot 2 views left (1 of 2)]
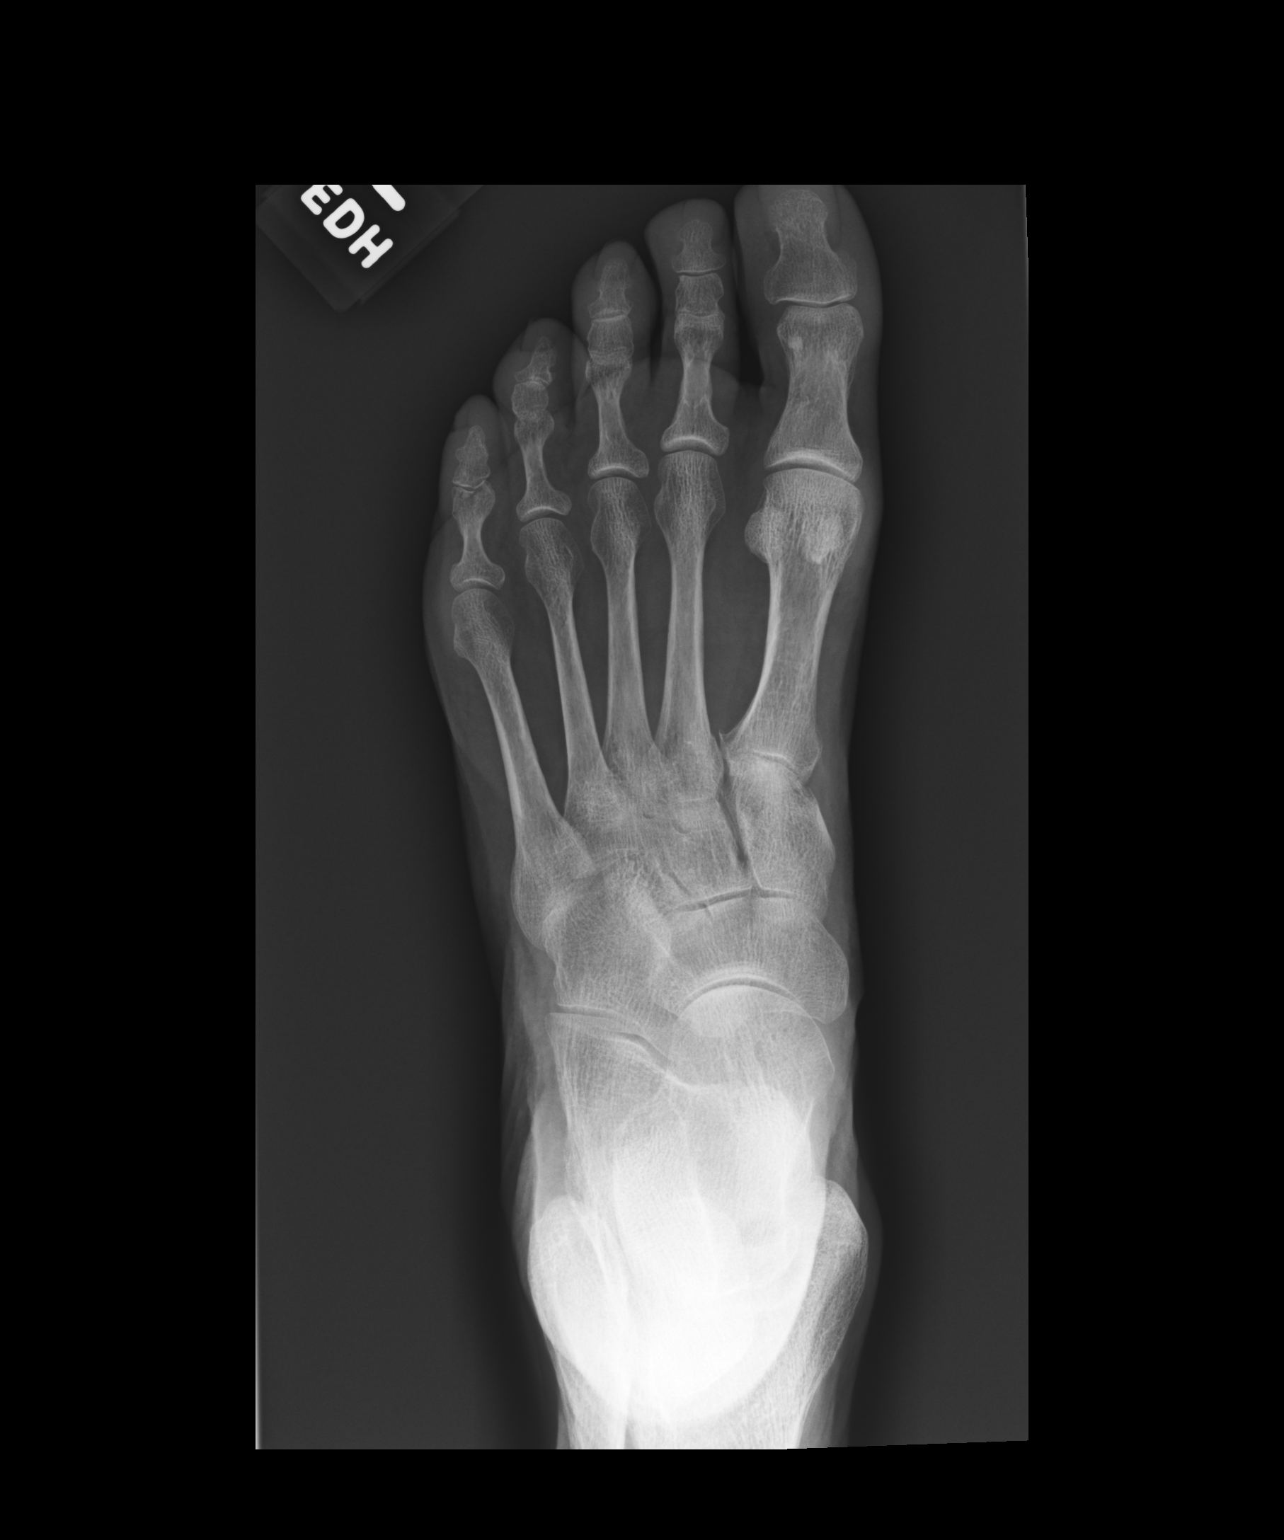

[dg foot 2 views left (2 of 2)]
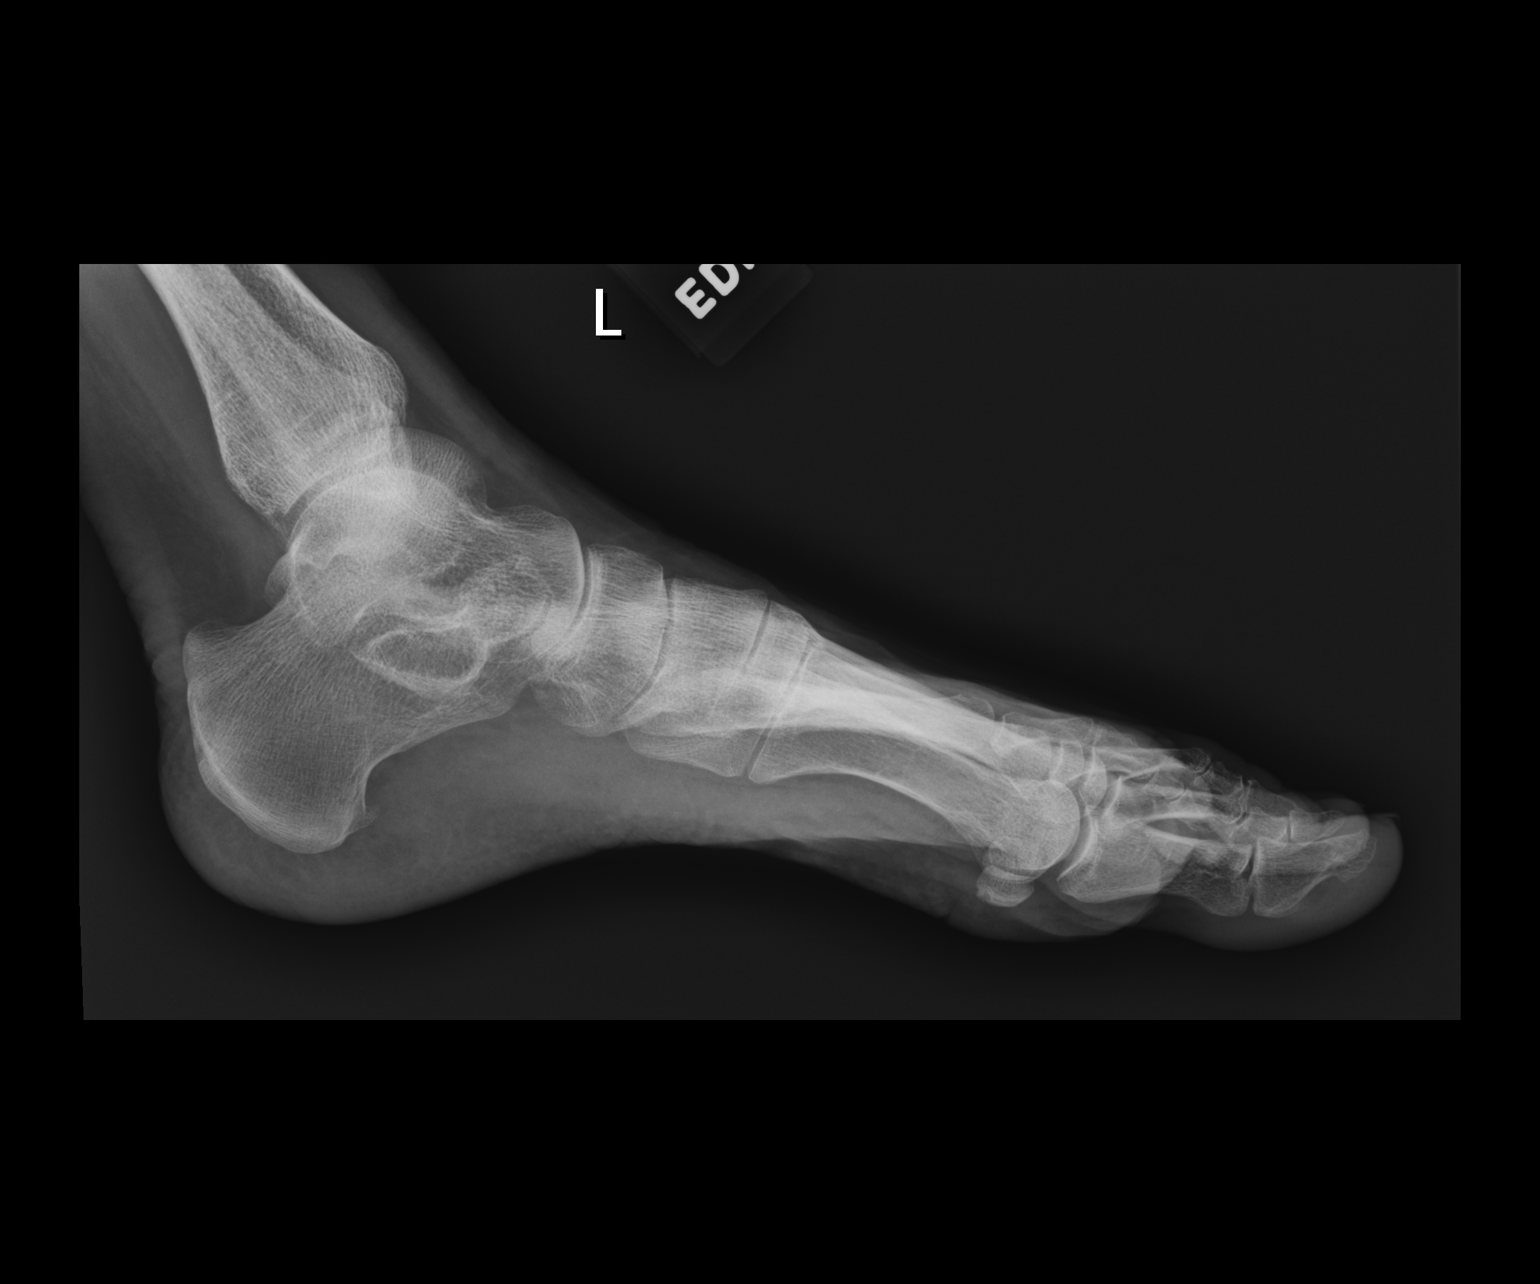

[2 of 2 positions shown; findings below may reference images not displayed]

FINDINGS: Normal alignment. Negative for fracture or dislocation. Mild
spurring along the plantar aspect of the calcaneus. No focal soft
tissue abnormality.
IMPRESSION: 1. No acute abnormality.
2. Calcaneal spurring.

## 2020-09-17 DIAGNOSIS — J302 Other seasonal allergic rhinitis: Secondary | ICD-10-CM | POA: Diagnosis not present

## 2020-09-17 DIAGNOSIS — H8112 Benign paroxysmal vertigo, left ear: Secondary | ICD-10-CM | POA: Diagnosis not present

## 2020-09-23 ENCOUNTER — Encounter: Payer: Self-pay | Admitting: Physical Therapy

## 2020-09-23 ENCOUNTER — Other Ambulatory Visit: Payer: Self-pay

## 2020-09-23 ENCOUNTER — Ambulatory Visit: Payer: Medicare Other | Attending: Family Medicine | Admitting: Physical Therapy

## 2020-09-23 DIAGNOSIS — H8111 Benign paroxysmal vertigo, right ear: Secondary | ICD-10-CM | POA: Diagnosis not present

## 2020-09-23 DIAGNOSIS — R262 Difficulty in walking, not elsewhere classified: Secondary | ICD-10-CM | POA: Diagnosis not present

## 2020-09-23 DIAGNOSIS — R42 Dizziness and giddiness: Secondary | ICD-10-CM | POA: Insufficient documentation

## 2020-09-23 DIAGNOSIS — R2681 Unsteadiness on feet: Secondary | ICD-10-CM | POA: Diagnosis not present

## 2020-09-23 NOTE — Therapy (Signed)
[facility] [address] [place], [place], [address] Phone: [phone]   Fax:  [phone]  Physical Therapy Evaluation  Patient Details  Name: Joanna Watkins MRN: [id number] Date of Birth: 08/29/1945 Referring Provider (PT): Laurann Montana, MD   Encounter Date: 09/23/2020   PT End of Session - 09/23/20 1208    Visit Number 1    Number of Visits 5    Date for PT Re-Evaluation 10/23/20    Authorization Type UHC Medicare    PT Start Time 0808    PT Stop Time 0843    PT Time Calculation (min) 35 min    Activity Tolerance Patient tolerated treatment well    Behavior During Therapy [facility] for tasks assessed/performed           Past Medical History:  Diagnosis Date  . Allergy history unknown   . Anemia   . Iron deficiency anemia 06/03/2012  . Osteopenia     Past Surgical History:  Procedure Laterality Date  . APPENDECTOMY  12/2009  . C4 Fracture      There were no vitals filed for this visit.    Subjective Assessment - 09/23/20 0812    Subjective Dizziness has been fine until a month ago; started to have spinning with lying down, supine <> sit and with rolling.  What is worse now is that it is affecting her when she walks, wants to bump wall to wall.    Pertinent History Anemia, osteopenia, BPPV, C4 fracture    Limitations Walking    Patient Stated Goals Make the dizziness go away again    Currently in Pain? Yes    Pain Location Neck    Pain Orientation Right    Pain Descriptors / Indicators Tightness    Pain Type Acute pain    Aggravating Factors  looking down to read              [facility] PT Assessment - 09/23/20 0814      Assessment   Medical Diagnosis Vertigo    Referring Provider (PT) Laurann Montana, MD    Onset Date/Surgical Date 09/20/20    Prior Therapy yes for vertigo      Precautions   Precautions Other (comment)    Precaution Comments Anemia, osteopenia, BPPV, C4 fracture       Balance Screen   Has the patient fallen in the past 6 months No      Prior Function   Level of Independence Independent      Observation/Other Assessments   Focus on Therapeutic Outcomes (FOTO)  DPS: 43%; DFS: 50.6%      Ambulation/Gait   Ambulation/Gait Yes    Ambulation/Gait Assistance 4: Min guard    Gait Comments When ambulating after treatment pt required min guard for safety due to veering to the R                  Vestibular Assessment - 09/23/20 0814      Symptom Behavior   Subjective history of current problem Reports intermittent neck pain when looking down for a long time; mild nausea but no vomiting, denies changes in vision and hearing, denies HA.    Type of Dizziness  Spinning    Frequency of Dizziness daily    Duration of Dizziness spinning lasts a few seconds, veering/wooziness can last for a few minutes    Symptom Nature Motion provoked;Positional    Aggravating Factors Lying supine;Supine to sit;Comment;Sitting with head tilted back;Turning  head quickly;Turning body quickly   initially when walking   Relieving Factors Slow movements    Progression of Symptoms No change since onset    History of similar episodes treated for vertigo 5 years ago      Oculomotor Exam   Oculomotor Alignment Normal    Ocular ROM WFL    Spontaneous Absent    Gaze-induced  Absent    Smooth Pursuits Intact    Saccades Intact    Comment mild disequilibrium with eye movements and head movements      Oculomotor Exam-Fixation Suppressed    Left Head Impulse negative    Right Head Impulse negative      Vestibulo-Ocular Reflex   VOR to Slow Head Movement Normal    VOR Cancellation Normal      Positional Testing   Dix-Hallpike Dix-Hallpike Right;Dix-Hallpike Left    Horizontal Canal Testing Horizontal Canal Right;Horizontal Canal Left      Dix-Hallpike Right   Dix-Hallpike Right Duration 5 seconds    Dix-Hallpike Right Symptoms Upbeat, right rotatory nystagmus       Horizontal Canal Right   Horizontal Canal Right Duration 0    Horizontal Canal Right Symptoms Normal      Horizontal Canal Left   Horizontal Canal Left Duration 0    Horizontal Canal Left Symptoms Normal              Objective measurements completed on examination: See above findings.        Vestibular Treatment/Exercise - 09/23/20 0827      Vestibular Treatment/Exercise   Vestibular Treatment Provided Canalith Repositioning    Canalith Repositioning Epley Manuever Right;Comment       EPLEY MANUEVER RIGHT   Number of Reps  2    Overall Response Improved Symptoms    Response Details  no upbeating nystagmus upon reassessment, changed to down beating without rotary component.  Performed deep head hang assessment; no rotary component noted      OTHER   Comment Deep head hang x 30 > 30 flexion x 30 seconds > sitting upright x 30.  3rd reassessment pt's nystagmus changed back to R rotary, upbeating.                 PT Education - 09/23/20 1208    Education Details clinical findings, PT POC and goals    Person(s) Educated Patient    Methods Explanation    Comprehension Verbalized understanding               PT Long Term Goals - 09/23/20 1214      PT LONG TERM GOAL #1   Title Pt will demonstrate independence with performing home CRM    Time 4    Period Weeks    Status New    Target Date 10/23/20      PT LONG TERM GOAL #2   Title Pt will increase DPS to 60% and will increase DFS by 5 points    Baseline DPS: 43%; DFS: 50.6    Time 4    Period Weeks    Status New    Target Date 10/23/20      PT LONG TERM GOAL #3   Title Pt will demonstrate full resolution of BPPV for all canals, bilaterally    Time 4    Period Weeks    Status New    Target Date 10/23/20                  Plan -  09/23/20 1209    Clinical Impression Statement Pt is a 75 year old female referred to Neuro OPPT for evaluation of vertigo.  Pt's PMH is significant for the  following: Anemia, osteopenia, BPPV, C4 fracture. The following deficits were noted during pt's exam: upbeating, R rotary nystagmus during R dix-hallpike as well as downbeating nystagmus without rotary component during deep head hang indicating R posterior canal canalithiasis of both long and short arm of the posterior canal, impaired balance and difficulty walking; treated x 2 with CRM and 1 deep head hang with improvement in nystagmus but pt did begin to experience nausea so unable to determine if symptoms resolved with three treatments.  Pt would benefit from skilled PT to address these impairments and functional limitations to maximize functional mobility independence and reduce falls risk.    Personal Factors and Comorbidities Comorbidity 3+;Past/Current Experience;Transportation    Comorbidities Anemia, osteopenia, BPPV, C4 fracture    Examination-Activity Limitations Bed Mobility;Locomotion Level;Reach Overhead    Examination-Participation Restrictions Community Activity    Stability/Clinical Decision Making Stable/Uncomplicated    Clinical Decision Making Low    Rehab Potential Excellent    PT Frequency 1x / week    PT Duration 4 weeks    PT Treatment/Interventions ADLs/Self Care Home Management;Canalith Repostioning;Gait training;Functional mobility training;Therapeutic activities;Therapeutic exercise;Balance training;Neuromuscular re-education;Vestibular    PT Next Visit Plan re-check R BPPV and treat if indicated, check L side.  Teach home maneuver    Consulted and Agree with Plan of Care Patient           Patient will benefit from skilled therapeutic intervention in order to improve the following deficits and impairments:  Decreased balance,Difficulty walking,Dizziness  Visit Diagnosis: BPPV (benign paroxysmal positional vertigo), right  Dizziness and giddiness  Unsteadiness on feet  Difficulty in walking, not elsewhere classified     Problem List Patient Active Problem  List   Diagnosis Date Noted  . Leukopenia 01/20/2016  . Iron deficiency anemia 06/03/2012  . Anemia   . Osteopenia     [name], PT, DPT 09/23/20    12:17 PM    [facility] [address] [place], [place], [address] Phone: [phone]   Fax:  [phone]  Name: Joanna Watkins MRN: [id number] Date of Birth: 12-Jan-1946

## 2020-09-28 ENCOUNTER — Ambulatory Visit: Payer: Medicare Other | Admitting: Physical Therapy

## 2020-09-28 ENCOUNTER — Other Ambulatory Visit: Payer: Self-pay

## 2020-09-28 ENCOUNTER — Encounter: Payer: Self-pay | Admitting: Physical Therapy

## 2020-09-28 DIAGNOSIS — H8111 Benign paroxysmal vertigo, right ear: Secondary | ICD-10-CM

## 2020-09-28 DIAGNOSIS — R42 Dizziness and giddiness: Secondary | ICD-10-CM

## 2020-09-28 DIAGNOSIS — R2681 Unsteadiness on feet: Secondary | ICD-10-CM

## 2020-09-28 DIAGNOSIS — R262 Difficulty in walking, not elsewhere classified: Secondary | ICD-10-CM | POA: Diagnosis not present

## 2020-09-28 NOTE — Therapy (Signed)
[facility] [address] [place], [place], [address] Phone: [phone]   Fax:  [phone]  Physical Therapy Treatment  Patient Details  Name: Joanna Watkins MRN: [id number] Date of Birth: 1945/08/26 Referring Provider (PT): Laurann Montana, MD   Encounter Date: 09/28/2020   PT End of Session - 09/28/20 0929    Visit Number 2    Number of Visits 5    Date for PT Re-Evaluation 10/23/20    Authorization Type UHC Medicare    PT Start Time 0852    PT Stop Time 0926    PT Time Calculation (min) 34 min    Activity Tolerance Patient tolerated treatment well    Behavior During Therapy [facility] for tasks assessed/performed           Past Medical History:  Diagnosis Date  . Allergy history unknown   . Anemia   . Iron deficiency anemia 06/03/2012  . Osteopenia     Past Surgical History:  Procedure Laterality Date  . APPENDECTOMY  12/2009  . C4 Fracture      There were no vitals filed for this visit.   Subjective Assessment - 09/28/20 0854    Subjective Thursday, Friday, and Saturday were great.  Sunday when pt looked up it brought on a spin and is a little off balance today.    Pertinent History Anemia, osteopenia, BPPV, C4 fracture    Limitations Walking    Patient Stated Goals Make the dizziness go away again                   Vestibular Assessment - 09/28/20 0913      Positional Testing   Dix-Hallpike Dix-Hallpike Right;Dix-Hallpike Left      Dix-Hallpike Right   Dix-Hallpike Right Duration 5 seconds    Dix-Hallpike Right Symptoms Downbeat Nystagmus   no rotary component seen     Dix-Hallpike Left   Dix-Hallpike Left Duration 5 seconds    Dix-Hallpike Left Symptoms Downbeat Nystagmus   no rotary component observed     Positional Sensitivities   Nose to Right Knee No dizziness    Right Knee to Sitting No dizziness    Nose to Left Knee No dizziness    Left Knee to Sitting No dizziness    Head  Turning x 5 No dizziness    Head Nodding x 5 No dizziness    Pivot Right in Standing No dizziness    Pivot Left in Standing No dizziness    Positional Sensitivities Comments mild imbalance with turning but no dizziness                     Vestibular Treatment/Exercise - 09/28/20 0914      Vestibular Treatment/Exercise   Vestibular Treatment Provided Canalith Repositioning    Canalith Repositioning Comment      OTHER   Comment Performed deep head hang x 2 without improvement in downbeating nystagmus and symptoms when returning to sitting.  Also performed forward 360 rotation for R anterior canal x 1.  Resolution of symptoms after forward 360 rotation.                 PT Education - 09/28/20 0920    Education Details Pt asking about movement restrictions; discussed there is no evidence for restrictions but recommended pt sleep on L side for 1-2 nights to decrease risk for re-occurence.  Plan for next session    Person(s) Educated Patient    Methods  Explanation    Comprehension Verbalized understanding               PT Long Term Goals - 09/23/20 1214      PT LONG TERM GOAL #1   Title Pt will demonstrate independence with performing home CRM    Time 4    Period Weeks    Status New    Target Date 10/23/20      PT LONG TERM GOAL #2   Title Pt will increase DPS to 60% and will increase DFS by 5 points    Baseline DPS: 43%; DFS: 50.6    Time 4    Period Weeks    Status New    Target Date 10/23/20      PT LONG TERM GOAL #3   Title Pt will demonstrate full resolution of BPPV for all canals, bilaterally    Time 4    Period Weeks    Status New    Target Date 10/23/20                 Plan - 09/28/20 0929    Clinical Impression Statement Pt demonstrates full resolution of posterior canal BPPV but continued to present with downbeating nystagmus but unable to see rotary component.  Treated with deep head hang and then forward 360 roll for R anterior  canal with resolution of downbeating nystagmus.  Pt did not demonstrate any residual motion sensitivity.  If symptoms are resolved next session plan to re-teach home CRM and D/C.    Personal Factors and Comorbidities Comorbidity 3+;Past/Current Experience;Transportation    Comorbidities Anemia, osteopenia, BPPV, C4 fracture    Examination-Activity Limitations Bed Mobility;Locomotion Level;Reach Overhead    Examination-Participation Restrictions Community Activity    Stability/Clinical Decision Making Stable/Uncomplicated    Rehab Potential Excellent    PT Frequency 1x / week    PT Duration 4 weeks    PT Treatment/Interventions ADLs/Self Care Home Management;Canalith Repostioning;Gait training;Functional mobility training;Therapeutic activities;Therapeutic exercise;Balance training;Neuromuscular re-education;Vestibular    PT Next Visit Plan Recheck and treat R BPPV if indicated.  Teach home maneuver.  Check goals/FOTO and D/C    Consulted and Agree with Plan of Care Patient           Patient will benefit from skilled therapeutic intervention in order to improve the following deficits and impairments:  Decreased balance,Difficulty walking,Dizziness  Visit Diagnosis: BPPV (benign paroxysmal positional vertigo), right  Dizziness and giddiness  Unsteadiness on feet  Difficulty in walking, not elsewhere classified     Problem List Patient Active Problem List   Diagnosis Date Noted  . Leukopenia 01/20/2016  . Iron deficiency anemia 06/03/2012  . Anemia   . Osteopenia     Joanna Watkins, PT, DPT 09/28/20    9:32 AM    [facility] [address] [place], [place], [address] Phone: [phone]   Fax:  [phone]  Name: Joanna Watkins MRN: [id number] Date of Birth: 05-10-1946

## 2020-10-05 ENCOUNTER — Other Ambulatory Visit: Payer: Self-pay

## 2020-10-05 ENCOUNTER — Ambulatory Visit: Payer: Medicare Other | Admitting: Physical Therapy

## 2020-10-05 ENCOUNTER — Encounter: Payer: Self-pay | Admitting: Physical Therapy

## 2020-10-05 DIAGNOSIS — R262 Difficulty in walking, not elsewhere classified: Secondary | ICD-10-CM

## 2020-10-05 DIAGNOSIS — R42 Dizziness and giddiness: Secondary | ICD-10-CM | POA: Diagnosis not present

## 2020-10-05 DIAGNOSIS — R2681 Unsteadiness on feet: Secondary | ICD-10-CM | POA: Diagnosis not present

## 2020-10-05 DIAGNOSIS — H8111 Benign paroxysmal vertigo, right ear: Secondary | ICD-10-CM | POA: Diagnosis not present

## 2020-10-05 NOTE — Therapy (Signed)
[facility] [address] [place], [place], [address] Phone: [phone]   Fax:  [phone]  Physical Therapy Treatment  Patient Details  Name: Joanna Watkins MRN: [id number] Date of Birth: 01-28-1946 Referring Provider (PT): Laurann Montana, MD   Encounter Date: 10/05/2020   PT End of Session - 10/05/20 0840    Visit Number 3    Number of Visits 5    Date for PT Re-Evaluation 10/23/20    Authorization Type UHC Medicare    PT Start Time 0805    PT Stop Time 0835    PT Time Calculation (min) 30 min    Activity Tolerance Patient limited by pain    Behavior During Therapy [facility] for tasks assessed/performed           Past Medical History:  Diagnosis Date  . Allergy history unknown   . Anemia   . Iron deficiency anemia 06/03/2012  . Osteopenia     Past Surgical History:  Procedure Laterality Date  . APPENDECTOMY  12/2009  . C4 Fracture      There were no vitals filed for this visit.   Subjective Assessment - 10/05/20 0806    Subjective Had a couple episodes of dizziness this past week but it has been much better; no longer off balance.  Has a muscle spasm in L low back.    Pertinent History Anemia, osteopenia, BPPV, C4 fracture    Limitations Walking    Patient Stated Goals Make the dizziness go away again    Currently in Pain? Yes                   Vestibular Assessment - 10/05/20 0808      Positional Testing   Dix-Hallpike Dix-Hallpike Right;Dix-Hallpike Left    Horizontal Canal Testing Horizontal Canal Right;Horizontal Canal Left      Dix-Hallpike Right   Dix-Hallpike Right Duration 0    Dix-Hallpike Right Symptoms No nystagmus      Dix-Hallpike Left   Dix-Hallpike Left Duration 5 seconds    Dix-Hallpike Left Symptoms Upbeat, left rotatory nystagmus      Sidelying Left   Sidelying Left Duration 5 seconds    Sidelying Left Symptoms Upbeat, left rotatory nystagmus                     [facility] Adult PT Treatment/Exercise - 10/05/20 0819      Exercises   Exercises Other Exercises    Other Exercises  Due to pt reporting significant L lower back pain and mm spasms which may also be limiting ability to participate in BPPV treatment, transitioned focus of session to guiding patient through stretches below to address quadratus lumborum, hamstring and piriformis tightness and pain.  Pt return demonstrated each exercise and reported improvement in pain after performing stretches.           Vestibular Treatment/Exercise - 10/05/20 0814      Vestibular Treatment/Exercise   Vestibular Treatment Provided Canalith Repositioning    Canalith Repositioning Epley Manuever Left       EPLEY MANUEVER LEFT    RESPONSE DETAILS LEFT attempted to initiate L CRM but pt reported intense nausea and did not wish ot treat today.           Access Code: [email or web address] URL: [email or web address] Date: 10/05/2020 Prepared by: Bufford Lope  Exercises Long Sitting Hamstring Stretch - 2 x daily - 7 x weekly - 2 sets - 20 second hold Seated Quadratus  Lumborum Stretch with Arm Overhead - 1 x daily - 7 x weekly - 2 sets - 20 second hold Seated Trunk Rotation - 1 x daily - 7 x weekly - 3 reps Seated Figure 4 Piriformis Stretch - 1 x daily - 7 x weekly - 2 sets - 20 seconds hold         PT Education - 10/05/20 0840    Education Details treat L BPPV next week; stretches for L low back    Person(s) Educated Patient    Methods Explanation;Demonstration;Handout    Comprehension Verbalized understanding;Returned demonstration               PT Long Term Goals - 09/23/20 1214      PT LONG TERM GOAL #1   Title Pt will demonstrate independence with performing home CRM    Time 4    Period Weeks    Status New    Target Date 10/23/20      PT LONG TERM GOAL #2   Title Pt will increase DPS to 60% and will increase DFS by 5 points    Baseline DPS: 43%; DFS: 50.6     Time 4    Period Weeks    Status New    Target Date 10/23/20      PT LONG TERM GOAL #3   Title Pt will demonstrate full resolution of BPPV for all canals, bilaterally    Time 4    Period Weeks    Status New    Target Date 10/23/20                 Plan - 10/05/20 0840    Clinical Impression Statement Pt continues to demonstrate full resolution of R BPPV but now presents with L rotary, upbeating nystagmus with L dix-hallpike indicating L posterior canal canalithiasis.  Attempted to treat but pt reporting significant L lower back pain/spasms and nausea.  Provided pt with back stretches/mobility.  Will attempt to treat L BPPV next week.    Personal Factors and Comorbidities Comorbidity 3+;Past/Current Experience;Transportation    Comorbidities Anemia, osteopenia, BPPV, C4 fracture    Examination-Activity Limitations Bed Mobility;Locomotion Level;Reach Overhead    Examination-Participation Restrictions Community Activity    Stability/Clinical Decision Making Stable/Uncomplicated    Rehab Potential Excellent    PT Frequency 1x / week    PT Duration 4 weeks    PT Treatment/Interventions ADLs/Self Care Home Management;Canalith Repostioning;Gait training;Functional mobility training;Therapeutic activities;Therapeutic exercise;Balance training;Neuromuscular re-education;Vestibular    PT Next Visit Plan How is her low back? Recheck and treat L BPPV if indicated.  Teach home maneuver.  Check goals/FOTO and D/C    Consulted and Agree with Plan of Care Patient           Patient will benefit from skilled therapeutic intervention in order to improve the following deficits and impairments:  Decreased balance,Difficulty walking,Dizziness  Visit Diagnosis: Dizziness and giddiness  Unsteadiness on feet  Difficulty in walking, not elsewhere classified     Problem List Patient Active Problem List   Diagnosis Date Noted  . Leukopenia 01/20/2016  . Iron deficiency anemia 06/03/2012   . Anemia   . Osteopenia     [name], PT, DPT 10/05/20    8:43 AM    [facility] [address] [place], [place], [address] Phone: [phone]   Fax:  [phone]  Name: Joanna Watkins MRN: [id number] Date of Birth: Oct 05, 1945

## 2020-10-05 NOTE — Patient Instructions (Signed)
Access Code: [email or web address] URL: [email or web address] Date: 10/05/2020 Prepared by: Bufford Lope  Exercises Long Sitting Hamstring Stretch - 2 x daily - 7 x weekly - 2 sets - 20 second hold Seated Quadratus Lumborum Stretch with Arm Overhead - 1 x daily - 7 x weekly - 2 sets - 20 second hold Seated Trunk Rotation - 1 x daily - 7 x weekly - 3 reps Seated Figure 4 Piriformis Stretch - 1 x daily - 7 x weekly - 2 sets - 20 seconds hold

## 2020-10-12 ENCOUNTER — Encounter: Payer: Self-pay | Admitting: Physical Therapy

## 2020-10-12 ENCOUNTER — Ambulatory Visit: Payer: Medicare Other | Attending: Family Medicine | Admitting: Physical Therapy

## 2020-10-12 ENCOUNTER — Other Ambulatory Visit: Payer: Self-pay

## 2020-10-12 DIAGNOSIS — R42 Dizziness and giddiness: Secondary | ICD-10-CM | POA: Diagnosis not present

## 2020-10-12 DIAGNOSIS — R262 Difficulty in walking, not elsewhere classified: Secondary | ICD-10-CM | POA: Diagnosis not present

## 2020-10-12 DIAGNOSIS — R2681 Unsteadiness on feet: Secondary | ICD-10-CM | POA: Insufficient documentation

## 2020-10-12 DIAGNOSIS — H8112 Benign paroxysmal vertigo, left ear: Secondary | ICD-10-CM | POA: Insufficient documentation

## 2020-10-12 DIAGNOSIS — H8111 Benign paroxysmal vertigo, right ear: Secondary | ICD-10-CM | POA: Diagnosis not present

## 2020-10-12 NOTE — Patient Instructions (Signed)
How to Perform the Epley Maneuver The Epley maneuver is an exercise that relieves symptoms of vertigo. Vertigo is the feeling that you or your surroundings are moving when they are not. When you feel vertigo, you may feel like the room is spinning and have trouble walking. Dizziness is a little different than vertigo. When you are dizzy, you may feel unsteady or light-headed. You can do this maneuver at home whenever you have symptoms of vertigo. You can do it up to 3 times a day until your symptoms go away. Even though the Epley maneuver may relieve your vertigo for a few weeks, it is possible that your symptoms will return. This maneuver relieves vertigo, but it does not relieve dizziness. What are the risks? If it is done correctly, the Epley maneuver is considered safe. Sometimes it can lead to dizziness or nausea that goes away after a short time. If you develop other symptoms, such as changes in vision, weakness, or numbness, stop doing the maneuver and call your health care provider. How to perform the Epley maneuver 1. Sit on the edge of a bed or table with your back straight and your legs extended or hanging over the edge of the bed or table. 2. Turn your head halfway toward the affected ear or side. 3. Lie backward quickly with your head turned until you are lying flat on your back. You may want to position a pillow under your shoulders. 4. Hold this position for 30 seconds. You may experience an attack of vertigo. This is normal. 5. Turn your head to the opposite direction until your unaffected ear is facing the floor. 6. Hold this position for 30 seconds. You may experience an attack of vertigo. This is normal. Hold this position until the vertigo stops. 7. Turn your whole body to the same side as your head. Hold for another 30 seconds. 8. Sit back up. You can repeat this exercise up to 3 times a day. Follow these instructions at home:  After doing the Epley maneuver, you can return to  your normal activities.  Ask your health care provider if there is anything you should do at home to prevent vertigo. He or she may recommend that you: ? Keep your head raised (elevated) with two or more pillows while you sleep. ? Do not sleep on the side of your affected ear. ? Get up slowly from bed. ? Avoid sudden movements during the day. ? Avoid extreme head movement, like looking up or bending over. Contact a health care provider if:  Your vertigo gets worse.  You have other symptoms, including: ? Nausea. ? Vomiting. ? Headache. Get help right away if:  You have vision changes.  You have a severe or worsening headache or neck pain.  You cannot stop vomiting.  You have new numbness or weakness in any part of your body. Summary  Vertigo is the feeling that you or your surroundings are moving when they are not.  The Epley maneuver is an exercise that relieves symptoms of vertigo.  If the Epley maneuver is done correctly, it is considered safe. You can do it up to 3 times a day. This information is not intended to replace advice given to you by your health care provider. Make sure you discuss any questions you have with your health care provider. Document Released: 08/05/2013 Document Revised: 06/20/2016 Document Reviewed: 06/20/2016 Elsevier Interactive Patient Education  2017 [employer]     IF you don't feel anything on the  right, come back up, turn your head to the left and lie back again.     ________________________________________________________________________________  Perform this next exercise for 2 weeks, 2 times a day.   Gaze Stabilization: Standing Feet Together    Feet together, keeping eyes on target on wall __3__ feet away, tilt head down 15-30 and move head side to side for __60__ seconds. Repeat while moving head up and down for __60__ seconds. Do _2___ sessions per day.

## 2020-10-12 NOTE — Therapy (Signed)
[facility] [address] [place], [place], [address] Phone: [phone]   Fax:  [phone]  Physical Therapy Treatment  Patient Details  Name: Joanna Watkins MRN: [id number] Date of Birth: Nov 04, 1945 Referring Provider (PT): Harlan Stains, MD   Encounter Date: 10/12/2020   PT End of Session - 10/12/20 0930    Visit Number 4    Number of Visits 5    Date for PT Re-Evaluation 10/23/20    Authorization Type UHC Medicare    PT Start Time 1700    PT Stop Time 0929    PT Time Calculation (min) 42 min    Activity Tolerance Patient tolerated treatment well    Behavior During Therapy [facility] for tasks assessed/performed           Past Medical History:  Diagnosis Date  . Allergy history unknown   . Anemia   . Iron deficiency anemia 06/03/2012  . Osteopenia     Past Surgical History:  Procedure Laterality Date  . APPENDECTOMY  12/2009  . C4 Fracture      There were no vitals filed for this visit.   Subjective Assessment - 10/12/20 0851    Subjective Back is much better, took about a week to improve.  Not veering L and R now; only felt a little dizziness when bending down to the ground.    Pertinent History Anemia, osteopenia, BPPV, C4 fracture    Limitations Walking    Patient Stated Goals Make the dizziness go away again    Currently in Pain? No/denies              [facility] PT Assessment - 10/12/20 0930      Observation/Other Assessments   Focus on Therapeutic Outcomes (FOTO)  DPS: 70%; DFS: 61.5%               Vestibular Assessment - 10/12/20 0852      Dix-Hallpike Left   Dix-Hallpike Left Duration 5 seconds    Dix-Hallpike Left Symptoms Upbeat, left rotatory nystagmus      Positional Sensitivities   Sit to Supine No dizziness    Supine to Left Side No dizziness    Supine to Right Side No dizziness    Supine to Sitting Lightheadedness    Right Hallpike No dizziness    Up from Right Hallpike  No dizziness    Up from Left Hallpike Mild dizziness    Nose to Right Knee No dizziness    Right Knee to Sitting No dizziness    Nose to Left Knee No dizziness    Left Knee to Sitting No dizziness    Head Turning x 5 No dizziness    Head Nodding x 5 No dizziness    Rolling Right No dizziness    Rolling Left No dizziness                     Vestibular Treatment/Exercise - 10/12/20 0856      Vestibular Treatment/Exercise   Vestibular Treatment Provided Canalith Repositioning;Gaze    Canalith Repositioning Epley Manuever Left    Gaze Exercises X1 Viewing Horizontal;X1 Viewing Vertical       EPLEY MANUEVER LEFT   Number of Reps  2    Overall Response  Symptoms Resolved     RESPONSE DETAILS LEFT no back pain today, no nausea.  Also performed home Epley maneuver with pillow behind back as part of HEP education      X1 Viewing  Horizontal   Foot Position standing narrow BOS    Reps 1    Comments 60 seconds      X1 Viewing Vertical   Foot Position standing; narrow BOS    Reps 1    Comments 60 seconds                 PT Education - 10/12/20 1217    Education Details home Epley manuever, will keep next week's appointment in case of return of vertigo but it symptoms resolve will D/C and cancel appointment    Person(s) Educated Patient    Methods Explanation;Demonstration;Handout    Comprehension Verbalized understanding;Returned demonstration               PT Long Term Goals - 10/12/20 1218      PT LONG TERM GOAL #1   Title Pt will demonstrate independence with performing home CRM    Time 4    Period Weeks    Status Achieved      PT LONG TERM GOAL #2   Title Pt will increase DPS to 60% and will increase DFS by 5 points    Baseline increased to 70% and 61.5%    Time 4    Period Weeks    Status Achieved      PT LONG TERM GOAL #3   Title Pt will demonstrate full resolution of BPPV for all canals, bilaterally    Time 4    Period Weeks    Status  Achieved                 Plan - 10/12/20 1218    Clinical Impression Statement Due to improvement in back pain PT able to assess and treat L posterior canal BPPV which resolved with one CRM.  Educated pt on how to perform self treatment at home, review of VOR x1 viewing, indications for return to therapy and plan for final visit.  Pt agreeable to plan; if symptoms continue to be resolved will D/C, if symptoms return will treat as indicated.  All LTG have been met.    Personal Factors and Comorbidities Comorbidity 3+;Past/Current Experience;Transportation    Comorbidities Anemia, osteopenia, BPPV, C4 fracture    Examination-Activity Limitations Bed Mobility;Locomotion Level;Reach Overhead    Examination-Participation Restrictions Community Activity    Stability/Clinical Decision Making Stable/Uncomplicated    Rehab Potential Excellent    PT Frequency 1x / week    PT Duration 4 weeks    PT Treatment/Interventions ADLs/Self Care Home Management;Canalith Repostioning;Gait training;Functional mobility training;Therapeutic activities;Therapeutic exercise;Balance training;Neuromuscular re-education;Vestibular    PT Next Visit Plan D/C    Consulted and Agree with Plan of Care Patient           Patient will benefit from skilled therapeutic intervention in order to improve the following deficits and impairments:  Decreased balance,Difficulty walking,Dizziness  Visit Diagnosis: Dizziness and giddiness  Unsteadiness on feet  Difficulty in walking, not elsewhere classified  BPPV (benign paroxysmal positional vertigo), right     Problem List Patient Active Problem List   Diagnosis Date Noted  . Leukopenia 01/20/2016  . Iron deficiency anemia 06/03/2012  . Anemia   . Osteopenia     Rico Junker, PT, DPT 10/12/20    12:22 PM    [facility] [address] [place], [place], [address] Phone: [phone]   Fax:   [phone]  Name: Joanna Watkins MRN: [id number] Date of Birth: [birth year]

## 2020-10-14 DIAGNOSIS — H353131 Nonexudative age-related macular degeneration, bilateral, early dry stage: Secondary | ICD-10-CM | POA: Diagnosis not present

## 2020-10-14 DIAGNOSIS — H26492 Other secondary cataract, left eye: Secondary | ICD-10-CM | POA: Diagnosis not present

## 2020-10-14 DIAGNOSIS — H40013 Open angle with borderline findings, low risk, bilateral: Secondary | ICD-10-CM | POA: Diagnosis not present

## 2020-10-14 DIAGNOSIS — Z961 Presence of intraocular lens: Secondary | ICD-10-CM | POA: Diagnosis not present

## 2020-10-14 DIAGNOSIS — H524 Presbyopia: Secondary | ICD-10-CM | POA: Diagnosis not present

## 2020-10-21 ENCOUNTER — Ambulatory Visit: Payer: Medicare Other | Admitting: Physical Therapy

## 2020-10-21 ENCOUNTER — Encounter: Payer: Self-pay | Admitting: Physical Therapy

## 2020-10-21 ENCOUNTER — Other Ambulatory Visit: Payer: Self-pay

## 2020-10-21 DIAGNOSIS — R42 Dizziness and giddiness: Secondary | ICD-10-CM

## 2020-10-21 DIAGNOSIS — H8112 Benign paroxysmal vertigo, left ear: Secondary | ICD-10-CM | POA: Diagnosis not present

## 2020-10-21 DIAGNOSIS — R2681 Unsteadiness on feet: Secondary | ICD-10-CM | POA: Diagnosis not present

## 2020-10-21 DIAGNOSIS — R262 Difficulty in walking, not elsewhere classified: Secondary | ICD-10-CM

## 2020-10-21 DIAGNOSIS — H8111 Benign paroxysmal vertigo, right ear: Secondary | ICD-10-CM | POA: Diagnosis not present

## 2020-10-21 NOTE — Therapy (Signed)
[facility] [address] [place], [place], [address] Phone: [phone]   Fax:  [phone]  Physical Therapy Treatment  Patient Details  Name: Joanna Watkins MRN: [id number] Date of Birth: 10/05/1945 Referring Provider (PT): Laurann Montana, MD   Encounter Date: 10/21/2020   PT End of Session - 10/21/20 0834    Visit Number 5    Number of Visits 5    Date for PT Re-Evaluation 10/23/20    Authorization Type UHC Medicare    PT Start Time 0811    PT Stop Time 0829    PT Time Calculation (min) 18 min    Activity Tolerance Patient tolerated treatment well    Behavior During Therapy [facility] for tasks assessed/performed           Past Medical History:  Diagnosis Date  . Allergy history unknown   . Anemia   . Iron deficiency anemia 06/03/2012  . Osteopenia     Past Surgical History:  Procedure Laterality Date  . APPENDECTOMY  12/2009  . C4 Fracture      There were no vitals filed for this visit.   Subjective Assessment - 10/21/20 0811    Subjective Sunday pt was lighting a stove and looked up and felt spinning.  Tried the home Epley and didn't feel like it helped.  Had a better day on Monday/Tuesday but has felt woozy.    Pertinent History Anemia, osteopenia, BPPV, C4 fracture    Limitations Walking    Patient Stated Goals Make the dizziness go away again    Currently in Pain? No/denies                   Vestibular Assessment - 10/21/20 0812      Dix-Hallpike Right   Dix-Hallpike Right Duration 0    Dix-Hallpike Right Symptoms No nystagmus   modified with pillow behind back     Dix-Hallpike Left   Dix-Hallpike Left Duration 5 seconds    Dix-Hallpike Left Symptoms Upbeat, left rotatory nystagmus   modified with pillow behind back                    Vestibular Treatment/Exercise - 10/21/20 0823      Vestibular Treatment/Exercise   Vestibular Treatment Provided Canalith Repositioning        EPLEY MANUEVER LEFT   Number of Reps  2    Overall Response  Symptoms Resolved     RESPONSE DETAILS LEFT practiced home Epley with pillow behind back, therapist verbally guiding patient through manuever.                 PT Education - 10/21/20 0834    Education Details reviewed home epley manuever again as part of treatment for recurring L posterior canal BPPV; will keep chart open in case of return of BPPV and pt unable to clear with home manuever    Person(s) Educated Patient    Methods Explanation;Demonstration    Comprehension Verbalized understanding;Returned demonstration               PT Long Term Goals - 10/12/20 1218      PT LONG TERM GOAL #1   Title Pt will demonstrate independence with performing home CRM    Time 4    Period Weeks    Status Achieved      PT LONG TERM GOAL #2   Title Pt will increase DPS to 60% and will increase DFS by 5 points  Baseline increased to 70% and 61.5%    Time 4    Period Weeks    Status Achieved      PT LONG TERM GOAL #3   Title Pt will demonstrate full resolution of BPPV for all canals, bilaterally    Time 4    Period Weeks    Status Achieved                 Plan - 10/21/20 0835    Clinical Impression Statement Pt demonstrates return of L posterior canal BPPV and pt was unable to utilize home maneuver to clear.  Reviewed home testing sequencing and home Epley manuever with pillow behind back having pt verbalize sequence.  Able to clear L posterior canal BPPV with one CRM.  Will leave patient's chart open x 4 weeks to allow pt to return if she experiences another reoccurence of BPPV and is unable to clear with home manuever.  If no other reoccurence, will D/C.    Personal Factors and Comorbidities Comorbidity 3+;Past/Current Experience;Transportation    Comorbidities Anemia, osteopenia, BPPV, C4 fracture    Examination-Activity Limitations Bed Mobility;Locomotion Level;Reach Overhead     Examination-Participation Restrictions Community Activity    Stability/Clinical Decision Making Stable/Uncomplicated    Rehab Potential Excellent    PT Frequency 1x / week    PT Duration 4 weeks    PT Treatment/Interventions ADLs/Self Care Home Management;Canalith Repostioning;Gait training;Functional mobility training;Therapeutic activities;Therapeutic exercise;Balance training;Neuromuscular re-education;Vestibular    PT Next Visit Plan D/C    Consulted and Agree with Plan of Care Patient           Patient will benefit from skilled therapeutic intervention in order to improve the following deficits and impairments:  Decreased balance,Difficulty walking,Dizziness  Visit Diagnosis: Dizziness and giddiness  BPPV (benign paroxysmal positional vertigo), left  Unsteadiness on feet  Difficulty in walking, not elsewhere classified     Problem List Patient Active Problem List   Diagnosis Date Noted  . Leukopenia 01/20/2016  . Iron deficiency anemia 06/03/2012  . Anemia   . Osteopenia     [name], PT, DPT 10/21/20    8:39 AM    [facility] [address] [place], [place], [address] Phone: [phone]   Fax:  [phone]  Name: TONYETTA BERKO MRN: [id number] Date of Birth: Jan 13, 1946

## 2021-03-13 DIAGNOSIS — H1089 Other conjunctivitis: Secondary | ICD-10-CM | POA: Diagnosis not present

## 2021-03-21 DIAGNOSIS — H04123 Dry eye syndrome of bilateral lacrimal glands: Secondary | ICD-10-CM | POA: Diagnosis not present

## 2021-03-21 DIAGNOSIS — H16141 Punctate keratitis, right eye: Secondary | ICD-10-CM | POA: Diagnosis not present

## 2021-06-01 DIAGNOSIS — E559 Vitamin D deficiency, unspecified: Secondary | ICD-10-CM | POA: Diagnosis not present

## 2021-06-01 DIAGNOSIS — M810 Age-related osteoporosis without current pathological fracture: Secondary | ICD-10-CM | POA: Diagnosis not present

## 2021-06-01 DIAGNOSIS — H8110 Benign paroxysmal vertigo, unspecified ear: Secondary | ICD-10-CM | POA: Diagnosis not present

## 2021-06-01 DIAGNOSIS — Z23 Encounter for immunization: Secondary | ICD-10-CM | POA: Diagnosis not present

## 2021-06-01 DIAGNOSIS — E785 Hyperlipidemia, unspecified: Secondary | ICD-10-CM | POA: Diagnosis not present

## 2021-06-01 DIAGNOSIS — Z0000 Encounter for general adult medical examination without abnormal findings: Secondary | ICD-10-CM | POA: Diagnosis not present

## 2021-06-17 ENCOUNTER — Ambulatory Visit: Payer: Medicare Other | Attending: Family Medicine

## 2021-06-17 ENCOUNTER — Other Ambulatory Visit: Payer: Self-pay

## 2021-06-17 DIAGNOSIS — H8112 Benign paroxysmal vertigo, left ear: Secondary | ICD-10-CM | POA: Insufficient documentation

## 2021-06-17 DIAGNOSIS — R42 Dizziness and giddiness: Secondary | ICD-10-CM | POA: Insufficient documentation

## 2021-06-17 DIAGNOSIS — R2681 Unsteadiness on feet: Secondary | ICD-10-CM | POA: Diagnosis not present

## 2021-06-17 NOTE — Therapy (Signed)
[facility] [address] [place], [place], [address] Phone: [phone]   Fax:  [phone]  Physical Therapy Evaluation  Patient Details  Name: Joanna Watkins MRN: [id number] Date of Birth: 09-23-1945 Referring Provider (PT): Laurann Montana, MD   Encounter Date: 06/17/2021   PT End of Session - 06/17/21 0846     Visit Number 1    Number of Visits 5    Date for PT Re-Evaluation 07/29/21    Authorization Type UHC Medicare    Progress Note Due on Visit 10    PT Start Time [phone]    PT Stop Time 0926    PT Time Calculation (min) 40 min    Activity Tolerance Patient tolerated treatment well    Behavior During Therapy [facility] for tasks assessed/performed             Past Medical History:  Diagnosis Date   Allergy history unknown    Anemia    Iron deficiency anemia 06/03/2012   Osteopenia     Past Surgical History:  Procedure Laterality Date   APPENDECTOMY  12/2009   C4 Fracture      There were no vitals filed for this visit.    Subjective Assessment - 06/17/21 0849     Subjective Patient reports that she noticed the Vertigo has returned. When she is working out, she notices she cannot lay back without feeling dizzy. Patient reports she also notices the dizziness with bed mobility. Denies hearing/vision changes. No falls.    Pertinent History Anemia, osteopenia, BPPV, C4 fracture, prior hx of BPPV    Limitations Walking    Patient Stated Goals Resolve the Dizziness    Currently in Pain? No/denies                [facility] PT Assessment - 06/17/21 0850       Assessment   Medical Diagnosis Vertigo    Referring Provider (PT) Laurann Montana, MD    Onset Date/Surgical Date 06/03/21    Prior Therapy prior PT at this clinic for vergio      Precautions   Precautions Other (comment)    Precaution Comments Anemia, osteopenia, BPPV, C4 fracture      Balance Screen   Has the patient fallen in the past 6  months No    Has the patient had a decrease in activity level because of a fear of falling?  No    Is the patient reluctant to leave their home because of a fear of falling?  No      Home Environment   Living Environment Private residence    Additional Comments denies difficulty ambulating around home      Prior Function   Level of Independence Independent    Leisure Reports she completing workouts 2x/week, limiting her ability to lay flat      Cognition   Overall Cognitive Status Within Functional Limits for tasks assessed      Observation/Other Assessments   Focus on Therapeutic Outcomes (FOTO)  DPS: 54% , DFS:61.4%      Sensation   Light Touch Appears Intact      ROM / Strength   AROM / PROM / Strength Strength      Strength   Overall Strength Within functional limits for tasks performed      Transfers   Transfers Sit to Stand;Stand to Sit    Sit to Stand 7: Independent    Stand to Sit 7: Independent  Ambulation/Gait   Ambulation/Gait Yes    Ambulation/Gait Assistance 7: Independent    Assistive device None    Gait Pattern Within Functional Limits    Ambulation Surface Level;Indoor    Gait Comments mild unsteadiness after treatment              Vestibular Assessment - 06/17/21 0001       Symptom Behavior   Subjective history of current problem See Subjective.    Type of Dizziness  Spinning;Vertigo    Frequency of Dizziness daily    Duration of Dizziness lasts seconds, immediately    Symptom Nature Motion provoked;Positional    Aggravating Factors Lying supine;Supine to sit;Sitting with head tilted back    Relieving Factors Slow movements    Progression of Symptoms No change since onset    History of similar episodes treated for vertigo 5 years ago, second episode earlier in 2022.      Oculomotor Exam   Oculomotor Alignment Normal    Ocular ROM WFL    Spontaneous Absent    Gaze-induced  Absent    Smooth Pursuits Intact    Saccades Intact       Oculomotor Exam-Fixation Suppressed    Left Head Impulse Negative    Right Head Impulse Negative      Vestibulo-Ocular Reflex   VOR 1 Head Only (x 1 viewing) Normal    VOR Cancellation Normal      Positional Testing   Dix-Hallpike Dix-Hallpike Right;Dix-Hallpike Left    Horizontal Canal Testing Horizontal Canal Right;Horizontal Canal Left      Dix-Hallpike Right   Dix-Hallpike Right Duration 0    Dix-Hallpike Right Symptoms No nystagmus      Dix-Hallpike Left   Dix-Hallpike Left Duration 5 seconds    Dix-Hallpike Left Symptoms Upbeat, left rotatory nystagmus      Horizontal Canal Right   Horizontal Canal Right Duration 0    Horizontal Canal Right Symptoms Normal      Horizontal Canal Left   Horizontal Canal Left Duration 0    Horizontal Canal Left Symptoms Normal                Objective measurements completed on examination: See above findings.        Vestibular Treatment/Exercise - 06/17/21 0001       Vestibular Treatment/Exercise   Vestibular Treatment Provided Canalith Repositioning    Canalith Repositioning Epley Manuever Left       EPLEY MANUEVER LEFT   Number of Reps  2    Overall Response  Symptoms Resolved     RESPONSE DETAILS LEFT upon reassessment, no nystagmus/symptoms reported. mild residual dizziness upon standing                    PT Education - 06/17/21 0926     Education Details Educated on POC/Eval findings; Reoocurence of L BPPV    Person(s) Educated Patient    Methods Explanation    Comprehension Verbalized understanding                 PT Long Term Goals - 06/17/21 0927       PT LONG TERM GOAL #1   Title Pt will demonstrate independence with performing home CRM    Baseline dependent    Time 4    Period Weeks    Status New    Target Date 07/29/21      PT LONG TERM GOAL #2   Title Pt will demo (-) positional testing  to indicate resolution of BPPV    Baseline L BPPV    Time 4    Period Weeks     Status New      PT LONG TERM GOAL #3   Title Pt will increase DPS to 59%    Baseline 49%    Time 4    Period Weeks    Status New                    Plan - 06/17/21 0934     Clinical Impression Statement Patient is a 75 y.o. female referred to Neuro OPPT services for Vertigo. patient's PMH significant for the following: Anemia, osteopenia, BPPV, C4 fracture, prior hx of BPPV. Patient presents with normal oculomotor exam. Patient did demonstrate positive positional testing. With positional testing patient demonstrating L Upbeating Nystagmus of short duration indicating L Posterior Canalithiasis. Completed CRM x 2 reps with resolution noted. Due to high rate of reoccurence, follow up visits scheduled to continue to assess and teach patient self management. patient will benefit from skilled PT services to progress toward LTGs.    Personal Factors and Comorbidities Comorbidity 3+;Past/Current Experience    Comorbidities Anemia, osteopenia, BPPV, C4 fracture, prior hx of BPPV    Examination-Activity Limitations Bed Mobility;Psychiatrist Activity;Other   Gym   Stability/Clinical Decision Making Stable/Uncomplicated    Clinical Decision Making Low    Rehab Potential Good    PT Frequency 1x / week    PT Duration 4 weeks    PT Treatment/Interventions ADLs/Self Care Home Management;Canalith Repostioning;Gait training;Functional mobility training;Therapeutic activities;Therapeutic exercise;Balance training;Neuromuscular re-education;Vestibular;Patient/family education;Manual techniques;Passive range of motion    PT Next Visit Plan Reassess L BPPV, treat as indicated. If resolved teach home manuever and HEP    Consulted and Agree with Plan of Care Patient             Patient will benefit from skilled therapeutic intervention in order to improve the following deficits and impairments:  Decreased balance, Difficulty  walking, Dizziness  Visit Diagnosis: BPPV (benign paroxysmal positional vertigo), left  Dizziness and giddiness  Unsteadiness on feet     Problem List Patient Active Problem List   Diagnosis Date Noted   Leukopenia 01/20/2016   Iron deficiency anemia 06/03/2012   Anemia    Osteopenia     Tempie Donning, PT, DPT 06/17/2021, 9:40 AM  [facility] [address] [place], [place], [address] Phone: [phone]   Fax:  [phone]  Name: Joanna Watkins MRN: [id number] Date of Birth: 07-06-1946

## 2021-06-28 ENCOUNTER — Ambulatory Visit: Payer: Medicare Other | Admitting: Physical Therapy

## 2021-06-28 ENCOUNTER — Other Ambulatory Visit: Payer: Self-pay

## 2021-06-28 DIAGNOSIS — H8112 Benign paroxysmal vertigo, left ear: Secondary | ICD-10-CM | POA: Diagnosis not present

## 2021-06-28 DIAGNOSIS — R2681 Unsteadiness on feet: Secondary | ICD-10-CM | POA: Diagnosis not present

## 2021-06-28 DIAGNOSIS — R42 Dizziness and giddiness: Secondary | ICD-10-CM | POA: Diagnosis not present

## 2021-06-28 NOTE — Patient Instructions (Signed)
Sit to Side-Lying    Sit on edge of bed. 1. Turn head 45 to right. 2. Maintain head position and lie down slowly on left side. Hold until symptoms subside. 3. Sit up slowly. Hold until symptoms subside. 4. Turn head 45 to left. 5. Maintain head position and lie down slowly on right side. Hold until symptoms subside. 6. Sit up slowly. Repeat sequence __5__ times per session. Do __3-5__ sessions per day.

## 2021-06-29 DIAGNOSIS — M810 Age-related osteoporosis without current pathological fracture: Secondary | ICD-10-CM | POA: Diagnosis not present

## 2021-06-29 NOTE — Therapy (Signed)
[facility] [address] [place], [place], [address] Phone: [phone]   Fax:  [phone]  Physical Therapy Treatment  Patient Details  Name: Joanna Watkins MRN: [id number] Date of Birth: 1946-06-07 Referring Provider (PT): Harlan Stains, MD   Encounter Date: 06/28/2021   PT End of Session - 06/29/21 1017     Visit Number 2    Number of Visits 5    Date for PT Re-Evaluation 07/29/21    Authorization Type [employer] Medicare    Progress Note Due on Visit 10    Activity Tolerance Patient tolerated treatment well    Behavior During Therapy Joanna Watkins for tasks assessed/performed             Past Medical History:  Diagnosis Date   Allergy history unknown    Anemia    Iron deficiency anemia 06/03/2012   Osteopenia     Past Surgical History:  Procedure Laterality Date   APPENDECTOMY  12/2009   C4 Fracture      There were no vitals filed for this visit.   Subjective Assessment - 06/29/21 1013     Subjective Pt reports the vertigo has mostly resolved - has not been dizzy since treatment was received during eval on 06-17-21    Pertinent History Anemia, osteopenia, BPPV, C4 fracture, prior hx of BPPV    Limitations Walking    Patient Stated Goals Resolve the Dizziness    Currently in Pain? No/denies                     Vestibular Assessment - 06/29/21 0001       Positional Testing   Dix-Hallpike Dix-Hallpike Left    Sidelying Test Sidelying Right;Sidelying Left      Dix-Hallpike Left   Dix-Hallpike Left Duration none    Dix-Hallpike Left Symptoms No nystagmus      Sidelying Right   Sidelying Right Duration none    Sidelying Right Symptoms No nystagmus      Sidelying Left   Sidelying Left Duration none    Sidelying Left Symptoms No nystagmus                     Gave pt article on BPPV etiology from [facility] - explained etiology of BPPV to patient          PT Education -  06/29/21 1015     Education Details Pt instructed in Brandt-Daroff exercises for self treatment as needed.  Pt declined instructions in Epley maneuver, stating she did not have anyone to assist her at home with this so it would not be needed.    Person(s) Educated Patient    Methods Explanation;Demonstration;Handout    Comprehension Verbalized understanding;Returned demonstration                 PT Long Term Goals - 06/29/21 1017       PT LONG TERM GOAL #1   Title Pt will demonstrate independence with performing home CRM    Baseline Deferred due to pt's request  - 06-28-21    Time 4    Period Weeks    Status Deferred      PT LONG TERM GOAL #2   Title Pt will demo (-) positional testing to indicate resolution of BPPV    Baseline L BPPV;  met 06-28-21    Time 4    Period Weeks    Status Achieved      PT LONG TERM  GOAL #3   Title Pt will increase DPS to 59%    Baseline 49%    Time 4    Period Weeks    Status On-going                   Plan - 06/29/21 1018     Clinical Impression Statement Pt met LTG #2 with Lt BPPV currently resolved and pt reported no dizziness at today's session.  Pt declined info on Epley maneuver for self treatment prn due to not having anyone to assist her with this maneuver and pt stating she would not attempt to perform independently.  Pt requests to cancel scheduled PT appts and keep final appt in 4 weeks to have prn should BPPV re-occur during next month.  Will treat prn or wll D/C if BPPV remains resolved.    Personal Factors and Comorbidities Comorbidity 3+;Past/Current Experience    Comorbidities Anemia, osteopenia, BPPV, C4 fracture, prior hx of BPPV    Examination-Activity Limitations Bed Mobility;Clinical cytogeneticist Activity;Other   Gym   Stability/Clinical Decision Making Stable/Uncomplicated    Rehab Potential Good    PT Frequency 1x / week    PT Duration 4 weeks     PT Treatment/Interventions ADLs/Self Care Home Management;Canalith Repostioning;Gait training;Functional mobility training;Therapeutic activities;Therapeutic exercise;Balance training;Neuromuscular re-education;Vestibular;Patient/family education;Manual techniques;Passive range of motion    PT Next Visit Plan D/C if pt doesn't return in 1 month    Consulted and Agree with Plan of Care Patient             Patient will benefit from skilled therapeutic intervention in order to improve the following deficits and impairments:  Decreased balance, Difficulty walking, Dizziness  Visit Diagnosis: BPPV (benign paroxysmal positional vertigo), left     Problem List Patient Active Problem List   Diagnosis Date Noted   Leukopenia 01/20/2016   Iron deficiency anemia 06/03/2012   Anemia    Osteopenia     Joanna Watkins, Joanna Watkins, PT 06/29/2021, 10:23 AM  [facility] [address] [place], [place], [address] Phone: [phone]   Fax:  [phone]  Name: Joanna Watkins MRN: [id number] Date of Birth: Dec 30, 1945

## 2021-07-12 ENCOUNTER — Encounter: Payer: Self-pay | Admitting: Physical Therapy

## 2021-07-14 DIAGNOSIS — Z1231 Encounter for screening mammogram for malignant neoplasm of breast: Secondary | ICD-10-CM | POA: Diagnosis not present

## 2021-07-14 DIAGNOSIS — M85852 Other specified disorders of bone density and structure, left thigh: Secondary | ICD-10-CM | POA: Diagnosis not present

## 2021-07-14 DIAGNOSIS — M85851 Other specified disorders of bone density and structure, right thigh: Secondary | ICD-10-CM | POA: Diagnosis not present

## 2021-07-19 ENCOUNTER — Encounter: Payer: Self-pay | Admitting: Physical Therapy

## 2021-07-26 ENCOUNTER — Ambulatory Visit: Payer: Medicare Other | Attending: Family Medicine | Admitting: Physical Therapy

## 2021-07-26 ENCOUNTER — Other Ambulatory Visit: Payer: Self-pay

## 2021-07-26 DIAGNOSIS — R2681 Unsteadiness on feet: Secondary | ICD-10-CM | POA: Diagnosis not present

## 2021-07-26 DIAGNOSIS — H8112 Benign paroxysmal vertigo, left ear: Secondary | ICD-10-CM | POA: Diagnosis not present

## 2021-07-26 DIAGNOSIS — R42 Dizziness and giddiness: Secondary | ICD-10-CM | POA: Diagnosis not present

## 2021-07-27 ENCOUNTER — Encounter: Payer: Self-pay | Admitting: Physical Therapy

## 2021-07-27 NOTE — Therapy (Signed)
[facility] [address] [place], [place], [address] Phone: [phone]   Fax:  [phone]  Physical Therapy Treatment  Patient Details  Name: Joanna Watkins MRN: [id number] Date of Birth: [birth year] Referring Provider (PT): Harlan Stains, MD   Encounter Date: 07/26/2021   PT End of Session - 07/27/21 [facility]     Visit Number 3    Number of Visits 5    Date for PT Re-Evaluation 07/29/21    Authorization Type [employer] Medicare    Progress Note Due on Visit 10    PT Start Time 0801    PT Stop Time 0832    PT Time Calculation (min) 31 min    Activity Tolerance Patient tolerated treatment well    Behavior During Therapy [facility] for tasks assessed/performed             Past Medical History:  Diagnosis Date   Allergy history unknown    Anemia    Iron deficiency anemia 06/03/2012   Osteopenia     Past Surgical History:  Procedure Laterality Date   APPENDECTOMY  12/2009   C4 Fracture      There were no vitals filed for this visit.   Subjective Assessment - 07/27/21 1823     Subjective Pt reports she was doing well with no vertigo until this past Saturday - states she felt it in the evening when she looked and reached up to get something out of cabinet    Pertinent History Anemia, osteopenia, BPPV, C4 fracture, prior hx of BPPV    Limitations Walking    Patient Stated Goals Resolve the Dizziness    Currently in Pain? No/denies                     Vestibular Assessment - 07/27/21 0001       Positional Testing   Dix-Hallpike Dix-Hallpike Right;Dix-Hallpike Left    Sidelying Watkins Sidelying Right;Sidelying Left      Dix-Hallpike Right   Dix-Hallpike Right Duration none    Dix-Hallpike Right Symptoms No nystagmus      Dix-Hallpike Left   Dix-Hallpike Left Duration approx. 6 secs    Dix-Hallpike Left Symptoms Upbeat, left rotatory nystagmus      Sidelying Right   Sidelying Right Duration none     Sidelying Right Symptoms No nystagmus      Sidelying Left   Sidelying Left Duration none    Sidelying Left Symptoms No nystagmus                       Vestibular Treatment/Exercise - 07/27/21 0001       Vestibular Treatment/Exercise   Vestibular Treatment Provided Canalith Repositioning    Canalith Repositioning Epley Manuever Left       EPLEY MANUEVER LEFT   Number of Reps  3    Overall Response  Symptoms Resolved     RESPONSE DETAILS LEFT mild dizziness with return to upright seated position                    PT Education - 07/27/21 1828     Education Details reviewed Laruth Bouchard- Daroff exercises for self treatment as needed    Person(s) Educated Patient    Methods Explanation;Demonstration    Comprehension Verbalized understanding;Returned demonstration                 PT Long Term Goals - 07/27/21 1834  PT LONG TERM GOAL #1   Title Pt will demonstrate independence with performing home CRM    Baseline Deferred due to pt's request  - 06-28-21    Time 4    Period Weeks    Status Deferred      PT LONG TERM GOAL #2   Title Pt will demo (-) positional testing to indicate resolution of BPPV    Baseline L BPPV;  met 06-28-21    Time 4    Period Weeks    Status Achieved      PT LONG TERM GOAL #3   Title Pt will increase DPS to 59%    Baseline 49%    Time 4    Period Weeks    Status On-going                   Plan - 07/27/21 1829     Clinical Impression Statement Pt returns to PT for re-assessment of BPPV with previous session 4 weeks ago on 06-28-21; pt has (+) Lt Dix-Hallpike Watkins indicative of Lt BPPV re-occurrence.  Pt was treated with 3 reps of Epley maneuver and symptoms appeared to be resolved on 3rd rep with no nystagmus and no c/o spinning vertigo in any position. Pt did report mild light-headedness with return to seated position from Rt sidelying position in final position of Epley maneuver.  Will continue to  assess and treat prn.    Personal Factors and Comorbidities Comorbidity 3+;Past/Current Experience    Comorbidities Anemia, osteopenia, BPPV, C4 fracture, prior hx of BPPV    Examination-Activity Limitations Bed Mobility;Clinical cytogeneticist Activity;Other   Gym   Stability/Clinical Decision Making Stable/Uncomplicated    Rehab Potential Good    PT Frequency 1x / week    PT Duration 4 weeks    PT Treatment/Interventions ADLs/Self Care Home Management;Canalith Repostioning;Gait training;Functional mobility training;Therapeutic activities;Therapeutic exercise;Balance training;Neuromuscular re-education;Vestibular;Patient/family education;Manual techniques;Passive range of motion    PT Next Visit Plan recheck Lt BPPV    Consulted and Agree with Plan of Care Patient             Patient will benefit from skilled therapeutic intervention in order to improve the following deficits and impairments:  Decreased balance, Difficulty walking, Dizziness  Visit Diagnosis: BPPV (benign paroxysmal positional vertigo), left     Problem List Patient Active Problem List   Diagnosis Date Noted   Leukopenia 01/20/2016   Iron deficiency anemia 06/03/2012   Anemia    Osteopenia     Lawrnce Reyez, [place], PT 07/27/2021, 6:36 PM  [facility] [address] [place], [place], [address] Phone: [phone]   Fax:  [phone]  Name: Joanna Watkins MRN: [id number] Date of Birth: 02/11/1946

## 2021-08-02 ENCOUNTER — Ambulatory Visit: Payer: Medicare Other | Admitting: Physical Therapy

## 2021-08-02 ENCOUNTER — Other Ambulatory Visit: Payer: Self-pay

## 2021-08-02 VITALS — BP 103/61 | HR 76

## 2021-08-02 DIAGNOSIS — H8112 Benign paroxysmal vertigo, left ear: Secondary | ICD-10-CM | POA: Diagnosis not present

## 2021-08-02 DIAGNOSIS — R2681 Unsteadiness on feet: Secondary | ICD-10-CM

## 2021-08-02 DIAGNOSIS — R42 Dizziness and giddiness: Secondary | ICD-10-CM

## 2021-08-03 NOTE — Therapy (Signed)
[facility] [address] [place], [place], [address] Phone: [phone]   Fax:  [phone]  Physical Therapy Treatment  Patient Details  Name: Joanna Watkins MRN: [id number] Date of Birth: [birth year] Referring Provider (PT): Harlan Stains, MD   Encounter Date: 08/02/2021   PT End of Session - 08/03/21 1506     Visit Number 4    Number of Visits 8    Date for PT Re-Evaluation 09/02/21    Authorization Type [employer] Medicare    Progress Note Due on Visit 10    PT Start Time 0715    PT Stop Time 0746    PT Time Calculation (min) 31 min    Activity Tolerance Patient tolerated treatment well    Behavior During Therapy [facility] for tasks assessed/performed             Past Medical History:  Diagnosis Date   Allergy history unknown    Anemia    Iron deficiency anemia 06/03/2012   Osteopenia     Past Surgical History:  Procedure Laterality Date   APPENDECTOMY  12/2009   C4 Fracture      Vitals:   08/02/21 0719 08/02/21 0721  BP: (!) 115/59 103/61  Pulse:  76     Subjective Assessment - 08/03/21 1458     Subjective Pt reports she did not have vertigo on Friday but had some light-headedness on Saturday; had it again this morning - states it is not true spinning vertigo but is a light-headed feeling    Pertinent History Anemia, osteopenia, BPPV, C4 fracture, prior hx of BPPV    Limitations Walking    Patient Stated Goals Resolve the Dizziness    Currently in Pain? No/denies                     Vestibular Assessment - 08/03/21 0001       Visual Acuity   Static line 9    Dynamic line 6      Positional Testing   Dix-Hallpike Dix-Hallpike Right;Dix-Hallpike Left    Sidelying Test Sidelying Right;Sidelying Left      Dix-Hallpike Right   Dix-Hallpike Right Duration none    Dix-Hallpike Right Symptoms No nystagmus      Dix-Hallpike Left   Dix-Hallpike Left Duration none    Dix-Hallpike Left  Symptoms No nystagmus      Sidelying Right   Sidelying Right Duration none    Sidelying Right Symptoms No nystagmus      Sidelying Left   Sidelying Left Duration none    Sidelying Left Symptoms No nystagmus                       Vestibular Treatment/Exercise - 08/03/21 0001       Vestibular Treatment/Exercise   Vestibular Treatment Provided Gaze    Gaze Exercises X1 Viewing Horizontal;X1 Viewing Vertical                Balance Exercises - 08/03/21 0001       Balance Exercises: Standing   Standing Eyes Opened Narrow base of support (BOS);Wide (BOA);Head turns;Foam/compliant surface;5 reps   horizontal & vertical   Standing Eyes Closed Narrow base of support (BOS);Wide (BOA);Head turns;Foam/compliant surface;5 reps   horizontal & vertical               PT Education - 08/03/21 1505     Education Details reviewed x1 viewing exercise (previously issued); gave balance  on foam - 4 positions - EO and EC    Person(s) Educated Patient    Methods Explanation;Demonstration;Handout    Comprehension Verbalized understanding;Returned demonstration                 PT Long Term Goals - 08/03/21 1507       PT LONG TERM GOAL #1   Title Pt will demonstrate independence with performing home CRM    Baseline Deferred due to pt's request  - 06-28-21    Time 4    Period Weeks    Status Deferred      PT LONG TERM GOAL #2   Title Pt will demo (-) positional testing to indicate resolution of BPPV    Baseline L BPPV;  met 06-28-21; negative L Dix-Hallpike test but pt continues to c/o light-headedness - 08-02-21    Time 4    Period Weeks    Status Achieved      PT LONG TERM GOAL #3   Title Pt will increase DPS to 59%    Baseline 49%    Time 4    Period Weeks    Status On-going    Target Date 09/09/21      PT LONG TERM GOAL #4   Title Pt will improve DVA to </= 2 line difference for improved gaze stabilization.    Baseline 3 line difference on  08-02-21    Time 4    Period Weeks    Status New    Target Date 09/09/21                   Plan - 08/03/21 1514     Clinical Impression Statement No LTG's have been fully met as pt does have (-) Lt Dix-Hallpike test with no nystagmus noted but she continues to c/o light-headedness.  This does not appear to be orthostatic hypotension as pt states this light-headed feeling is mostly constant in the mornings and does not increase with change in position.  Pt has a 3 line difference with DVA, indicative of abnormal VOR.  Pt will benefit from continued PT to address dizziness and balance/vestibular deficits.    Personal Factors and Comorbidities Comorbidity 3+;Past/Current Experience    Comorbidities Anemia, osteopenia, BPPV, C4 fracture, prior hx of BPPV    Examination-Activity Limitations Bed Mobility;Clinical cytogeneticist Activity;Other   Gym   Stability/Clinical Decision Making Stable/Uncomplicated    Rehab Potential Good    PT Frequency 1x / week    PT Duration 4 weeks    PT Treatment/Interventions ADLs/Self Care Home Management;Canalith Repostioning;Gait training;Functional mobility training;Therapeutic activities;Therapeutic exercise;Balance training;Neuromuscular re-education;Vestibular;Patient/family education;Manual techniques;Passive range of motion    PT Next Visit Plan recheck Lt BPPV    PT Home Exercise Plan x1 viewing, balance on foam - 4 positions    Consulted and Agree with Plan of Care Patient             Patient will benefit from skilled therapeutic intervention in order to improve the following deficits and impairments:  Decreased balance, Difficulty walking, Dizziness  Visit Diagnosis: BPPV (benign paroxysmal positional vertigo), left - Plan: PT plan of care cert/re-cert  Dizziness and giddiness - Plan: PT plan of care cert/re-cert  Unsteadiness on feet - Plan: PT plan of care  cert/re-cert     Problem List Patient Active Problem List   Diagnosis Date Noted   Leukopenia 01/20/2016   Iron deficiency anemia 06/03/2012   Anemia  Osteopenia     Alda Lea, PT 08/03/2021, 3:26 PM  [facility] [address] [place], [place], [address] Phone: [phone]   Fax:  [phone]  Name: ELLASON SEGAR MRN: [id number] Date of Birth: Apr 30, 1946

## 2021-08-18 ENCOUNTER — Ambulatory Visit: Payer: Medicare Other | Admitting: Physical Therapy

## 2021-08-22 DIAGNOSIS — U071 COVID-19: Secondary | ICD-10-CM | POA: Diagnosis not present

## 2021-10-14 DIAGNOSIS — Z961 Presence of intraocular lens: Secondary | ICD-10-CM | POA: Diagnosis not present

## 2021-10-14 DIAGNOSIS — H40013 Open angle with borderline findings, low risk, bilateral: Secondary | ICD-10-CM | POA: Diagnosis not present

## 2021-10-14 DIAGNOSIS — H35362 Drusen (degenerative) of macula, left eye: Secondary | ICD-10-CM | POA: Diagnosis not present

## 2021-10-14 DIAGNOSIS — H524 Presbyopia: Secondary | ICD-10-CM | POA: Diagnosis not present

## 2021-10-14 DIAGNOSIS — H353131 Nonexudative age-related macular degeneration, bilateral, early dry stage: Secondary | ICD-10-CM | POA: Diagnosis not present

## 2021-12-28 DIAGNOSIS — M810 Age-related osteoporosis without current pathological fracture: Secondary | ICD-10-CM | POA: Diagnosis not present

## 2022-04-04 DIAGNOSIS — K5730 Diverticulosis of large intestine without perforation or abscess without bleeding: Secondary | ICD-10-CM | POA: Diagnosis not present

## 2022-04-04 DIAGNOSIS — Z1211 Encounter for screening for malignant neoplasm of colon: Secondary | ICD-10-CM | POA: Diagnosis not present

## 2022-04-18 DIAGNOSIS — H40013 Open angle with borderline findings, low risk, bilateral: Secondary | ICD-10-CM | POA: Diagnosis not present

## 2022-06-15 DIAGNOSIS — Z23 Encounter for immunization: Secondary | ICD-10-CM | POA: Diagnosis not present

## 2022-06-15 DIAGNOSIS — E559 Vitamin D deficiency, unspecified: Secondary | ICD-10-CM | POA: Diagnosis not present

## 2022-06-15 DIAGNOSIS — M810 Age-related osteoporosis without current pathological fracture: Secondary | ICD-10-CM | POA: Diagnosis not present

## 2022-06-15 DIAGNOSIS — D509 Iron deficiency anemia, unspecified: Secondary | ICD-10-CM | POA: Diagnosis not present

## 2022-06-15 DIAGNOSIS — R636 Underweight: Secondary | ICD-10-CM | POA: Diagnosis not present

## 2022-06-15 DIAGNOSIS — E785 Hyperlipidemia, unspecified: Secondary | ICD-10-CM | POA: Diagnosis not present

## 2022-06-15 DIAGNOSIS — Z0000 Encounter for general adult medical examination without abnormal findings: Secondary | ICD-10-CM | POA: Diagnosis not present

## 2022-07-05 DIAGNOSIS — M810 Age-related osteoporosis without current pathological fracture: Secondary | ICD-10-CM | POA: Diagnosis not present

## 2022-07-20 DIAGNOSIS — Z1231 Encounter for screening mammogram for malignant neoplasm of breast: Secondary | ICD-10-CM | POA: Diagnosis not present

## 2022-10-17 DIAGNOSIS — H04123 Dry eye syndrome of bilateral lacrimal glands: Secondary | ICD-10-CM | POA: Diagnosis not present

## 2022-10-17 DIAGNOSIS — D3131 Benign neoplasm of right choroid: Secondary | ICD-10-CM | POA: Diagnosis not present

## 2022-10-17 DIAGNOSIS — H40013 Open angle with borderline findings, low risk, bilateral: Secondary | ICD-10-CM | POA: Diagnosis not present

## 2022-10-17 DIAGNOSIS — H524 Presbyopia: Secondary | ICD-10-CM | POA: Diagnosis not present

## 2022-11-08 DIAGNOSIS — H26492 Other secondary cataract, left eye: Secondary | ICD-10-CM | POA: Diagnosis not present

## 2022-11-08 DIAGNOSIS — H524 Presbyopia: Secondary | ICD-10-CM | POA: Diagnosis not present

## 2022-11-08 DIAGNOSIS — H04123 Dry eye syndrome of bilateral lacrimal glands: Secondary | ICD-10-CM | POA: Diagnosis not present

## 2022-11-13 DIAGNOSIS — H524 Presbyopia: Secondary | ICD-10-CM | POA: Diagnosis not present

## 2022-11-13 DIAGNOSIS — H04123 Dry eye syndrome of bilateral lacrimal glands: Secondary | ICD-10-CM | POA: Diagnosis not present

## 2022-11-29 DIAGNOSIS — D225 Melanocytic nevi of trunk: Secondary | ICD-10-CM | POA: Diagnosis not present

## 2022-11-29 DIAGNOSIS — L821 Other seborrheic keratosis: Secondary | ICD-10-CM | POA: Diagnosis not present

## 2023-01-04 DIAGNOSIS — M810 Age-related osteoporosis without current pathological fracture: Secondary | ICD-10-CM | POA: Diagnosis not present

## 2023-06-27 DIAGNOSIS — Z0000 Encounter for general adult medical examination without abnormal findings: Secondary | ICD-10-CM | POA: Diagnosis not present

## 2023-06-27 DIAGNOSIS — M545 Low back pain, unspecified: Secondary | ICD-10-CM | POA: Diagnosis not present

## 2023-06-27 DIAGNOSIS — E785 Hyperlipidemia, unspecified: Secondary | ICD-10-CM | POA: Diagnosis not present

## 2023-06-27 DIAGNOSIS — D509 Iron deficiency anemia, unspecified: Secondary | ICD-10-CM | POA: Diagnosis not present

## 2023-06-27 DIAGNOSIS — M810 Age-related osteoporosis without current pathological fracture: Secondary | ICD-10-CM | POA: Diagnosis not present

## 2023-06-27 DIAGNOSIS — R636 Underweight: Secondary | ICD-10-CM | POA: Diagnosis not present

## 2023-06-27 DIAGNOSIS — E559 Vitamin D deficiency, unspecified: Secondary | ICD-10-CM | POA: Diagnosis not present

## 2023-06-27 DIAGNOSIS — H6122 Impacted cerumen, left ear: Secondary | ICD-10-CM | POA: Diagnosis not present

## 2023-06-27 DIAGNOSIS — Z23 Encounter for immunization: Secondary | ICD-10-CM | POA: Diagnosis not present

## 2023-07-17 DIAGNOSIS — H35363 Drusen (degenerative) of macula, bilateral: Secondary | ICD-10-CM | POA: Diagnosis not present

## 2023-07-17 DIAGNOSIS — H26492 Other secondary cataract, left eye: Secondary | ICD-10-CM | POA: Diagnosis not present

## 2023-07-24 DIAGNOSIS — H26492 Other secondary cataract, left eye: Secondary | ICD-10-CM | POA: Diagnosis not present

## 2023-08-28 DIAGNOSIS — M810 Age-related osteoporosis without current pathological fracture: Secondary | ICD-10-CM | POA: Diagnosis not present

## 2023-08-28 DIAGNOSIS — M8588 Other specified disorders of bone density and structure, other site: Secondary | ICD-10-CM | POA: Diagnosis not present

## 2023-08-28 DIAGNOSIS — Z1231 Encounter for screening mammogram for malignant neoplasm of breast: Secondary | ICD-10-CM | POA: Diagnosis not present

## 2023-10-02 DIAGNOSIS — J209 Acute bronchitis, unspecified: Secondary | ICD-10-CM | POA: Diagnosis not present

## 2023-11-08 DIAGNOSIS — D3131 Benign neoplasm of right choroid: Secondary | ICD-10-CM | POA: Diagnosis not present

## 2023-11-08 DIAGNOSIS — H0100A Unspecified blepharitis right eye, upper and lower eyelids: Secondary | ICD-10-CM | POA: Diagnosis not present

## 2023-11-08 DIAGNOSIS — H0100B Unspecified blepharitis left eye, upper and lower eyelids: Secondary | ICD-10-CM | POA: Diagnosis not present

## 2023-11-08 DIAGNOSIS — H40013 Open angle with borderline findings, low risk, bilateral: Secondary | ICD-10-CM | POA: Diagnosis not present

## 2023-11-08 DIAGNOSIS — H524 Presbyopia: Secondary | ICD-10-CM | POA: Diagnosis not present

## 2023-12-26 DIAGNOSIS — M810 Age-related osteoporosis without current pathological fracture: Secondary | ICD-10-CM | POA: Diagnosis not present

## 2024-01-03 DIAGNOSIS — B880 Other acariasis: Secondary | ICD-10-CM | POA: Diagnosis not present

## 2024-01-03 DIAGNOSIS — H0100B Unspecified blepharitis left eye, upper and lower eyelids: Secondary | ICD-10-CM | POA: Diagnosis not present

## 2024-01-03 DIAGNOSIS — H0100A Unspecified blepharitis right eye, upper and lower eyelids: Secondary | ICD-10-CM | POA: Diagnosis not present

## 2024-03-19 DIAGNOSIS — D225 Melanocytic nevi of trunk: Secondary | ICD-10-CM | POA: Diagnosis not present

## 2024-03-19 DIAGNOSIS — L821 Other seborrheic keratosis: Secondary | ICD-10-CM | POA: Diagnosis not present

## 2024-07-04 ENCOUNTER — Other Ambulatory Visit (HOSPITAL_BASED_OUTPATIENT_CLINIC_OR_DEPARTMENT_OTHER): Payer: Self-pay | Admitting: Family Medicine

## 2024-07-04 DIAGNOSIS — E785 Hyperlipidemia, unspecified: Secondary | ICD-10-CM

## 2024-07-23 ENCOUNTER — Ambulatory Visit (HOSPITAL_BASED_OUTPATIENT_CLINIC_OR_DEPARTMENT_OTHER)
Admission: RE | Admit: 2024-07-23 | Discharge: 2024-07-23 | Disposition: A | Discharge status: Home / Self Care | Payer: Self-pay | Source: Ambulatory Visit | Attending: Family Medicine | Admitting: Family Medicine

## 2024-07-23 DIAGNOSIS — E785 Hyperlipidemia, unspecified: Secondary | ICD-10-CM
